# Patient Record
Sex: Male | Born: 1975 | Race: White | Hispanic: No | Marital: Married | Smoking: Never smoker
Health system: Southern US, Community
[De-identification: ages and names within clinical notes are randomized; demographics above are authoritative.]

## PROBLEM LIST (undated history)

## (undated) DIAGNOSIS — G4733 Obstructive sleep apnea (adult) (pediatric): Secondary | ICD-10-CM

## (undated) DIAGNOSIS — K649 Unspecified hemorrhoids: Secondary | ICD-10-CM

## (undated) DIAGNOSIS — J45909 Unspecified asthma, uncomplicated: Secondary | ICD-10-CM

## (undated) DIAGNOSIS — E559 Vitamin D deficiency, unspecified: Secondary | ICD-10-CM

## (undated) DIAGNOSIS — R3129 Other microscopic hematuria: Secondary | ICD-10-CM

## (undated) DIAGNOSIS — N528 Other male erectile dysfunction: Secondary | ICD-10-CM

## (undated) HISTORY — PX: TONSILLECTOMY: SUR1361

## (undated) HISTORY — DX: Obstructive sleep apnea (adult) (pediatric): G47.33

## (undated) HISTORY — DX: Other microscopic hematuria: R31.29

## (undated) HISTORY — DX: Other male erectile dysfunction: N52.8

## (undated) HISTORY — DX: Morbid (severe) obesity due to excess calories: E66.01

## (undated) HISTORY — DX: Vitamin D deficiency, unspecified: E55.9

## (undated) HISTORY — DX: Unspecified asthma, uncomplicated: J45.909

## (undated) HISTORY — DX: Unspecified hemorrhoids: K64.9

---

## 2001-08-26 ENCOUNTER — Encounter: Payer: Self-pay | Admitting: Family Medicine

## 2001-08-26 ENCOUNTER — Encounter: Admission: RE | Admit: 2001-08-26 | Discharge: 2001-08-26 | Payer: Self-pay | Admitting: Family Medicine

## 2012-10-14 DIAGNOSIS — E66813 Obesity, class 3: Secondary | ICD-10-CM | POA: Insufficient documentation

## 2012-10-14 HISTORY — DX: Obesity, class 3: E66.813

## 2012-10-14 HISTORY — DX: Morbid (severe) obesity due to excess calories: E66.01

## 2015-04-19 DIAGNOSIS — R3129 Other microscopic hematuria: Secondary | ICD-10-CM

## 2015-04-19 HISTORY — DX: Other microscopic hematuria: R31.29

## 2016-06-20 DIAGNOSIS — K649 Unspecified hemorrhoids: Secondary | ICD-10-CM

## 2016-06-20 HISTORY — DX: Unspecified hemorrhoids: K64.9

## 2017-01-08 DIAGNOSIS — E559 Vitamin D deficiency, unspecified: Secondary | ICD-10-CM

## 2017-01-08 HISTORY — DX: Vitamin D deficiency, unspecified: E55.9

## 2017-07-29 DIAGNOSIS — N528 Other male erectile dysfunction: Secondary | ICD-10-CM

## 2017-07-29 HISTORY — DX: Other male erectile dysfunction: N52.8

## 2017-10-11 DIAGNOSIS — G4733 Obstructive sleep apnea (adult) (pediatric): Secondary | ICD-10-CM

## 2017-10-11 HISTORY — DX: Obstructive sleep apnea (adult) (pediatric): G47.33

## 2017-10-12 ENCOUNTER — Encounter: Payer: Self-pay | Admitting: Emergency Medicine

## 2017-10-12 ENCOUNTER — Other Ambulatory Visit: Payer: Self-pay

## 2017-10-12 ENCOUNTER — Emergency Department (INDEPENDENT_AMBULATORY_CARE_PROVIDER_SITE_OTHER): Payer: BC Managed Care – PPO

## 2017-10-12 ENCOUNTER — Emergency Department
Admission: EM | Admit: 2017-10-12 | Discharge: 2017-10-12 | Disposition: A | Discharge status: Home / Self Care | Payer: BC Managed Care – PPO | Source: Home / Self Care | Attending: Family Medicine | Admitting: Family Medicine

## 2017-10-12 DIAGNOSIS — W2209XA Striking against other stationary object, initial encounter: Secondary | ICD-10-CM | POA: Diagnosis not present

## 2017-10-12 DIAGNOSIS — S92412A Displaced fracture of proximal phalanx of left great toe, initial encounter for closed fracture: Secondary | ICD-10-CM

## 2017-10-12 DIAGNOSIS — S92415A Nondisplaced fracture of proximal phalanx of left great toe, initial encounter for closed fracture: Secondary | ICD-10-CM

## 2017-10-12 NOTE — Discharge Instructions (Addendum)
Apply ice pack for 15 to 20 minutes, 3 to 4 times daily  Continue until pain and swelling decrease.  Buddy tape toes.  Wear post-op sandal.  Elevate foot whenever possible.  May take Tylenol as needed for pain.

## 2017-10-12 NOTE — ED Triage Notes (Signed)
Patient stubbed left large toe one hour ago; took 2 aleve.

## 2017-10-12 NOTE — ED Provider Notes (Signed)
[facility]    CSN: [id number] Arrival date & time: 10/12/17  1711     History   Chief Complaint Chief Complaint  Patient presents with  . Toe Pain    HPI Erik Roach is a 42 y.o. male.   Patient bumped his left great toe about one hour ago with resultant pain  The history is provided by the patient.  Toe Pain  Episode onset: one hour ago. The problem occurs constantly. The problem has not changed since onset.The symptoms are aggravated by walking. Nothing relieves the symptoms. Treatments tried: Aleve. The treatment provided mild relief.    History reviewed. No pertinent past medical history.  There are no active problems to display for this patient.   History reviewed. No pertinent surgical history.     Home Medications    Prior to Admission medications   Not on File    Family History No family history on file.  Social History Social History   Tobacco Use  . Smoking status: Not on file  Substance Use Topics  . Alcohol use: Not on file  . Drug use: Not on file     Allergies   Penicillins   Review of Systems Review of Systems  All other systems reviewed and are negative.    Physical Exam Triage Vital Signs ED Triage Vitals  Enc Vitals Group     BP 10/12/17 1729 123/78     Pulse Rate 10/12/17 1729 79     Resp 10/12/17 1729 18     Temp 10/12/17 1729 98 F (36.7 C)     Temp Source 10/12/17 1729 Oral     SpO2 10/12/17 1729 96 %     Weight 10/12/17 1730 (!) 350 lb (158.8 kg)     Height 10/12/17 1730 6\' 2"  (1.88 m)     Head Circumference --      Peak Flow --      Pain Score 10/12/17 1730 4     Pain Loc --      Pain Edu? --      Excl. in GC? --    No data found.  Updated Vital Signs BP 123/78 (BP Location: Right Arm)   Pulse 79   Temp 98 F (36.7 C) (Oral)   Resp 18   Ht 6\' 2"  (1.88 m)   Wt (!) 158.8 kg   SpO2 96%   BMI 44.94 kg/m   Visual Acuity Right Eye Distance:   Left Eye Distance:   Bilateral  Distance:    Right Eye Near:   Left Eye Near:    Bilateral Near:     Physical Exam  Constitutional: He appears well-developed and well-nourished. No distress.  HENT:  Head: Atraumatic.  Eyes: Pupils are equal, round, and reactive to light.  Cardiovascular: Normal rate.  Pulmonary/Chest: Effort normal.  Musculoskeletal:       Left foot: There is decreased range of motion, tenderness, bony tenderness and swelling. There is normal capillary refill, no crepitus, no deformity and no laceration.       Feet:  Left great toe has tenderness to palpation and mild swelling over the proximal phalanx and MTP joint.  Neurological: He is alert.  Skin: Skin is warm and dry.  Nursing note and vitals reviewed.    UC Treatments / Results  Labs (all labs ordered are listed, but only abnormal results are displayed) Labs Reviewed - No data to display  EKG None  Radiology Dg Foot Complete Left  Result Date: 10/12/2017 CLINICAL DATA:  Stubbed toe. EXAM: LEFT FOOT - COMPLETE 3+ VIEW COMPARISON:  None. FINDINGS: Small Achilles spur. Oblique fracture involving the proximal phalanx of the first digit. No intra-articular extension. IMPRESSION: Oblique fracture of the proximal phalanx of the first digit. Electronically Signed   By: Jeronimo GreavesKyle  Talbot M.D.   On: 10/12/2017 17:52    Procedures Procedures (including critical care time)  Medications Ordered in UC Medications - No data to display  Initial Impression / Assessment and Plan / UC Course  I have reviewed the triage vital signs and the nursing notes.  Pertinent labs & imaging results that were available during my care of the patient were reviewed by me and considered in my medical decision making (see chart for details).     Toe strapped using "Buddy Tape" technique.  Dispensed post-op sandal. Followup with Dr. Rodney Langtonhomas Thekkekandam or Dr. Clementeen GrahamEvan Corey ([facility]) if not improving about two weeks.    Final Clinical Impressions(s) /  UC Diagnoses   Final diagnoses:  Closed nondisplaced fracture of proximal phalanx of left great toe, initial encounter     Discharge Instructions     Apply ice pack for 15 to 20 minutes, 3 to 4 times daily  Continue until pain and swelling decrease.  Buddy tape toes.  Wear post-op sandal.  Elevate foot whenever possible.  May take Tylenol as needed for pain.    ED Prescriptions    None        Lattie HawBeese, Stephen A, MD 10/16/17 1021

## 2017-11-14 ENCOUNTER — Ambulatory Visit (INDEPENDENT_AMBULATORY_CARE_PROVIDER_SITE_OTHER): Payer: BC Managed Care – PPO | Admitting: Family Medicine

## 2017-11-14 ENCOUNTER — Ambulatory Visit (INDEPENDENT_AMBULATORY_CARE_PROVIDER_SITE_OTHER): Payer: BC Managed Care – PPO

## 2017-11-14 VITALS — BP 126/69 | HR 55 | Ht 73.0 in | Wt 358.0 lb

## 2017-11-14 DIAGNOSIS — S92415D Nondisplaced fracture of proximal phalanx of left great toe, subsequent encounter for fracture with routine healing: Secondary | ICD-10-CM

## 2017-11-14 DIAGNOSIS — S92415A Nondisplaced fracture of proximal phalanx of left great toe, initial encounter for closed fracture: Secondary | ICD-10-CM

## 2017-11-14 DIAGNOSIS — W2203XD Walked into furniture, subsequent encounter: Secondary | ICD-10-CM | POA: Diagnosis not present

## 2017-11-14 NOTE — Patient Instructions (Signed)
Thank you for coming in today. Use the cam walker boot.  Recheck in 2 weeks.

## 2017-11-15 ENCOUNTER — Encounter: Payer: Self-pay | Admitting: Family Medicine

## 2017-11-15 NOTE — Progress Notes (Signed)
    Subjective:    CC: Left great toe fracture  HPI: Erik Roach suffered a fracture of his left great toe he was seen in urgent care on August 17.  He was given a postop shoe and advised to follow-up with me in a week or 2.  He notes it has been 4 weeks now and his pain is still present in his toe with ambulation.  He denies any radiating pain weakness or numbness.  He is tried some Tylenol and buddy taping as well as the postop shoe.  He tried transitioning to a regular shoe last week which did not help.  He denies any fevers chills nausea vomiting or diarrhea.  Past medical history, Surgical history, Family history not pertinant except as noted below, Social history, Allergies, and medications have been entered into the medical record, reviewed, and no changes needed.   Review of Systems: No headache, visual changes, nausea, vomiting, diarrhea, constipation, dizziness, abdominal pain, skin rash, fevers, chills, night sweats, weight loss, swollen lymph nodes, body aches, joint swelling, muscle aches, chest pain, shortness of breath, mood changes, visual or auditory hallucinations.   Objective:    Vitals:   11/14/17 1442  BP: 126/69  Pulse: (!) 55   General: Well Developed, well nourished, and in no acute distress.  Neuro/Psych: Alert and oriented x3, extra-ocular muscles intact, able to move all 4 extremities, sensation grossly intact. Skin: Warm and dry, no rashes noted.  Respiratory: Not using accessory muscles, speaking in full sentences, trachea midline.  Cardiovascular: Pulses palpable, no extremity edema. Abdomen: Does not appear distended. MSK: Left great toe slightly swollen but nonerythematous.  No deformity noted. Tender to palpation at the proximal phalanx.  Capillary fill and sensation are intact distally.  Lab and Radiology Results X-ray images personally independently reviewed left great toe. Oblique nondisplaced fracture through the proximal phalanx involving the  interphalangeal joint.  Healing present since x-ray images dated August 17 however not fully healed.  No further displacement. Formal radiology review.    Impression and Recommendations:    Assessment and Plan: 42 y.o. male with left great toe fracture.  Unfortunately delayed of care from urgent care to sports medicine care.  Patient still somewhat symptomatic.  I think is reasonable to switch to a Cam walker boot as that will provide less pressure at the first toe.  Plan to proceed with a Cam walker boot and recheck in 2 weeks.  No need for buddy taping the great toe.  Continue Tylenol as needed..   Orders Placed This Encounter  Procedures  . DG Toe Great Left    Order Specific Question:   Reason for exam:    Answer:   follow up fracture    Order Specific Question:   [facility]?    [facility]   No orders of the defined types were placed in this encounter.   Discussed warning signs or symptoms. Please see discharge instructions. Patient expresses understanding.

## 2017-11-28 ENCOUNTER — Ambulatory Visit (INDEPENDENT_AMBULATORY_CARE_PROVIDER_SITE_OTHER): Payer: BC Managed Care – PPO

## 2017-11-28 ENCOUNTER — Ambulatory Visit: Payer: BC Managed Care – PPO | Admitting: Family Medicine

## 2017-11-28 DIAGNOSIS — W2209XD Striking against other stationary object, subsequent encounter: Secondary | ICD-10-CM

## 2017-11-28 DIAGNOSIS — S92415D Nondisplaced fracture of proximal phalanx of left great toe, subsequent encounter for fracture with routine healing: Secondary | ICD-10-CM

## 2017-11-28 DIAGNOSIS — S92415A Nondisplaced fracture of proximal phalanx of left great toe, initial encounter for closed fracture: Secondary | ICD-10-CM | POA: Diagnosis not present

## 2017-11-28 NOTE — Patient Instructions (Signed)
Thank you for coming in today. Continue cam walker  Continue Vit D.  Reasonable to add calcium., You can take 1000mg  of calcium in Tums.   Recheck with me in 4 weeks if all is well.   Next step is regular shoe with turf toe insole.   Get a Steel Turf Toe insole.  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

## 2017-11-28 NOTE — Progress Notes (Signed)
Erik Roach is a 42 y.o. male who presents to [facility] today for follow-up left great toe fracture.  Patient was first seen on September 19 for comminuted intra-articular fracture of the proximal phalanx of the left great toe.  His original injury was August 17 where he seen in urgent care.  He was somewhat lost to follow-up until his reestablish with me in mid September.  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  He notes with the Cam walker boot he is feeling much better with significantly reduced pain in his left foot.  He feels well and thinks things are improving.    ROS:  As above  Exam:   General: Well Developed, well nourished, and in no acute distress.  Neuro/Psych: Alert and oriented x3, extra-ocular muscles intact, able to move all 4 extremities, sensation grossly intact. Skin: Warm and dry, no rashes noted.  Respiratory: Not using accessory muscles, speaking in full sentences, trachea midline.  Cardiovascular: Pulses palpable, no extremity edema. Abdomen: Does not appear distended. MSK: Left foot relatively normal-appearing without significant swelling.  Toe is minimally tender.  Pulses cap refill and sensation are intact distally.    Lab and Radiology Results X-ray left great toe shows persistent fracture line at the proximal phalanx somewhat comminuted involving joint however no significant displacement.  Callus formation is present now and slight improved healing compared to prior studies. Await formal radiology review    Assessment and Plan: 42 y.o. male with left great toe fracture improving clinically and radiographically.  Continue Cam walker boot.  Recheck in 2 to 4 weeks.  Return sooner if needed.   I spent 15 minutes with this patient, greater than 50% was face-to-face time counseling regarding ddx and  plan.  Orders Placed This Encounter  Procedures  . DG Toe Great Left    Order Specific Question:   Reason for exam:    Answer:   foot pain    Order Specific Question:   Preferred imaging location?    Answer[facility]   No orders of the defined types were placed in this encounter.   Historical information moved to improve visibility of documentation.  Past Medical History:  Diagnosis Date  . Asthma   . Hemorrhoids 06/20/2016  . Microscopic hematuria 04/19/2015   Negative complete workup by Dr. Andrey Campanile as of 04/2015 Negative complete workup by Dr. Andrey Campanile as of 04/2015  . Obesity, Class III, BMI 40-49.9 (morbid obesity) (HCC) 10/14/2012  . Other male erectile dysfunction 07/29/2017  . Sleep apnea, obstructive 10/11/2017  . Vitamin D deficiency 01/08/2017   Past Surgical History:  Procedure Laterality Date  . TONSILLECTOMY     Social History   Tobacco Use  . Smoking status: Never Smoker  . Smokeless tobacco: Never Used  Substance Use Topics  . Alcohol use: Yes   family history is not on file.  Medications: Current Outpatient Medications  Medication Sig Dispense Refill  . fluticasone (FLONASE) 50 MCG/ACT nasal spray Place into the nose.    . loratadine (CLARITIN) 10 MG tablet Take by mouth.    . Misc. Devices MISC Initiate auto CPAP at 5-15 cm. water pressure.  New mask and supplies.    . Vitamin D, Ergocalciferol, (DRISDOL) 50000 units CAPS capsule Take by mouth.     No current facility-administered medications for this visit.  Allergies  Allergen Reactions  . Penicillins       Discussed warning signs or symptoms. Please see discharge instructions. Patient expresses understanding.

## 2017-12-26 ENCOUNTER — Ambulatory Visit (INDEPENDENT_AMBULATORY_CARE_PROVIDER_SITE_OTHER): Payer: BC Managed Care – PPO | Admitting: Family Medicine

## 2017-12-26 ENCOUNTER — Encounter: Payer: Self-pay | Admitting: Family Medicine

## 2017-12-26 ENCOUNTER — Ambulatory Visit (INDEPENDENT_AMBULATORY_CARE_PROVIDER_SITE_OTHER): Payer: BC Managed Care – PPO

## 2017-12-26 VITALS — BP 133/77 | HR 57 | Ht 74.0 in | Wt 366.0 lb

## 2017-12-26 DIAGNOSIS — W2203XD Walked into furniture, subsequent encounter: Secondary | ICD-10-CM | POA: Diagnosis not present

## 2017-12-26 DIAGNOSIS — E559 Vitamin D deficiency, unspecified: Secondary | ICD-10-CM | POA: Diagnosis not present

## 2017-12-26 DIAGNOSIS — S92415A Nondisplaced fracture of proximal phalanx of left great toe, initial encounter for closed fracture: Secondary | ICD-10-CM | POA: Diagnosis not present

## 2017-12-26 DIAGNOSIS — S92415D Nondisplaced fracture of proximal phalanx of left great toe, subsequent encounter for fracture with routine healing: Secondary | ICD-10-CM

## 2017-12-26 NOTE — Patient Instructions (Addendum)
Thank you for coming in today. I have referred to podiatry in the building here.  You should hear from them about an appointment soon.  If you do not hear anything by midweek next week let me know.   Continue boot.  Let me know what next steps are.   Get vit D checked. Can do that now with labs.

## 2017-12-26 NOTE — Progress Notes (Signed)
Erik Roach is a 42 y.o. male who presents to [facility] today for left great toe fracture.  Erik Roach suffered a fracture of his left great toe proximal phalanx about 10 weeks ago.  He was managed conservatively with Cam walker boot.  He notes the pain is pretty well controlled but if he tries to do any activity out of the boot it does hurt.  He does also note some limitation in range of motion of his toe.  He has a history of vitamin D deficiency and is taking both calcium and vitamin D.  He feels well otherwise no fevers or chills.    ROS:  As above  Exam:  BP 133/77   Pulse (!) 57   Ht 6\' 2"  (1.88 m)   Wt (!) 366 lb (166 kg)   BMI 46.99 kg/m  General: Well Developed, well nourished, and in no acute distress.  Neuro/Psych: Alert and oriented x3, extra-ocular muscles intact, able to move all 4 extremities, sensation grossly intact. Skin: Warm and dry, no rashes noted.  Respiratory: Not using accessory muscles, speaking in full sentences, trachea midline.  Cardiovascular: Pulses palpable, no extremity edema. Abdomen: Does not appear distended. MSK: Left great toe slightly swollen otherwise normal-appearing tender to palpation mildly at MTP and IP joints.  Decreased motion with pain present at both MTP and IP joint.  Pulses capillary refill and sensation are intact distally.    Lab and Radiology Results X-ray left great toe shows continued oblique fracture involving the interphalangeal joint without significant healing or callus formation.  No significant change from prior x-ray.  X-ray images personally independently reviewed. Await formal radiology review.    Assessment and Plan: 42 y.o. male with left great toe fracture.  Unfortunately fracture is not healing and is now 10 weeks and 2 conservative management.  At this point I am not optimistic about healing with continued immobilization and I think is reasonable to have a second  opinion with podiatry/orthopedics.  Will refer to podiatry and follow-up as needed.  Additionally given his history of vitamin D deficiency I think is reasonable to check a vitamin D level today as that may be exacerbating factor to his poor bone healing.    Orders Placed This Encounter  Procedures  . DG Toe Great Left    Order Specific Question:   Reason for exam:    Answer:   f/u fx    Order Specific Question:   Preferred imaging location?    [facility]   [facility]  . VITAMIN D 25 Hydroxy (Vit-D Deficiency, Fractures)  . Ambulatory referral to Podiatry    Referral Priority:   Routine    Referral Type:   Consultation    Referral Reason:   Specialty Services Required    Requested Specialty:   Podiatry    Number of Visits Requested:   1   No orders of the defined types were placed in this encounter.   Historical information moved to improve visibility of documentation.  Past Medical History:  Diagnosis Date  . Asthma   . Hemorrhoids 06/20/2016  . Microscopic hematuria 04/19/2015   Negative complete workup by Dr. Andrey Campanile as of 04/2015 Negative complete workup by Dr. Andrey Campanile as of 04/2015  . Obesity, Class III, BMI 40-49.9 (morbid obesity) (HCC) 10/14/2012  . Other male erectile dysfunction 07/29/2017  . Sleep apnea, obstructive 10/11/2017  . Vitamin D deficiency 01/08/2017   Past Surgical History:  Procedure Laterality Date  .  TONSILLECTOMY     Social History   Tobacco Use  . Smoking status: Never Smoker  . Smokeless tobacco: Never Used  Substance Use Topics  . Alcohol use: Yes   family history is not on file.  Medications: Current Outpatient Medications  Medication Sig Dispense Refill  . fluticasone (FLONASE) 50 MCG/ACT nasal spray Place into the nose.    . loratadine (CLARITIN) 10 MG tablet Take by mouth.    . Misc. Devices MISC Initiate auto CPAP at 5-15 cm. water pressure.  New mask and supplies.    . Vitamin D, Ergocalciferol, (DRISDOL) 50000 units CAPS  capsule Take by mouth.     No current facility-administered medications for this visit.    Allergies  Allergen Reactions  . Penicillins       Discussed warning signs or symptoms. Please see discharge instructions. Patient expresses understanding.

## 2017-12-27 ENCOUNTER — Encounter: Payer: Self-pay | Admitting: Family Medicine

## 2017-12-27 ENCOUNTER — Telehealth: Payer: Self-pay | Admitting: Family Medicine

## 2017-12-27 LAB — VITAMIN D 25 HYDROXY (VIT D DEFICIENCY, FRACTURES): VIT D 25 HYDROXY: 26 ng/mL — AB (ref 30–100)

## 2017-12-27 NOTE — Telephone Encounter (Signed)
Letter to the insurance company sent today.

## 2017-12-27 NOTE — Telephone Encounter (Signed)
Patient has been advised. Rhonda Cunningham,CMA  

## 2020-02-27 DIAGNOSIS — U071 COVID-19: Secondary | ICD-10-CM

## 2020-02-27 HISTORY — DX: COVID-19: U07.1

## 2020-08-12 IMAGING — DX DG TOE GREAT 2+V*L*
3 series · 3 of 3 positions shown · non-contrast
Comparison: 10/12/2017 left foot radiographs

CLINICAL DATA: Follow-up first toe fracture

EXAM:
LEFT GREAT TOE

[toe ap]
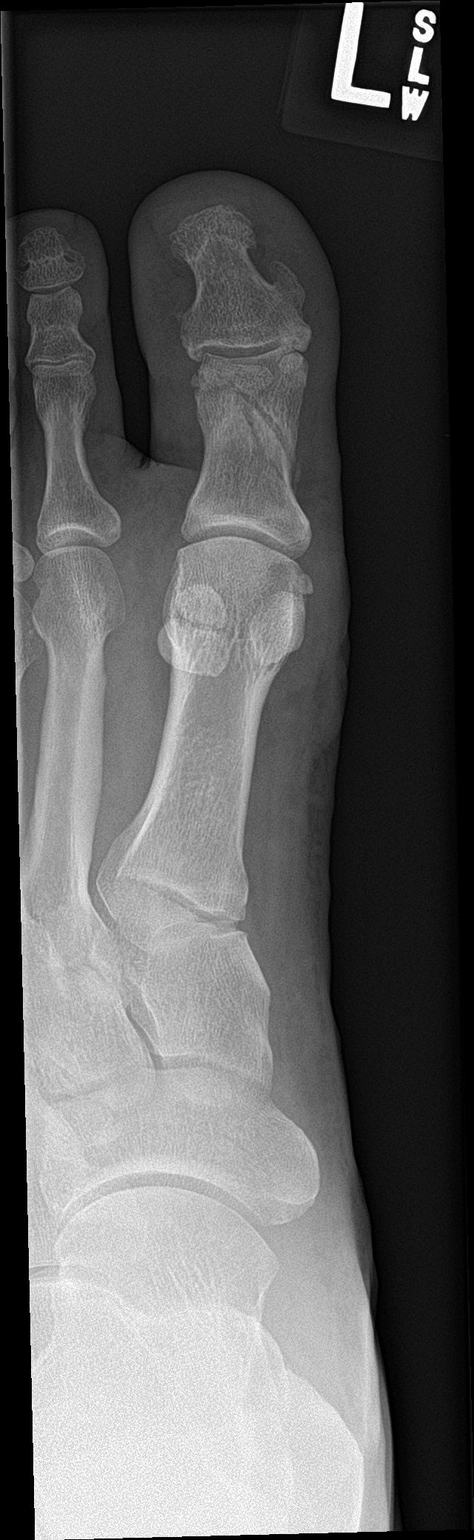

[toe obl]
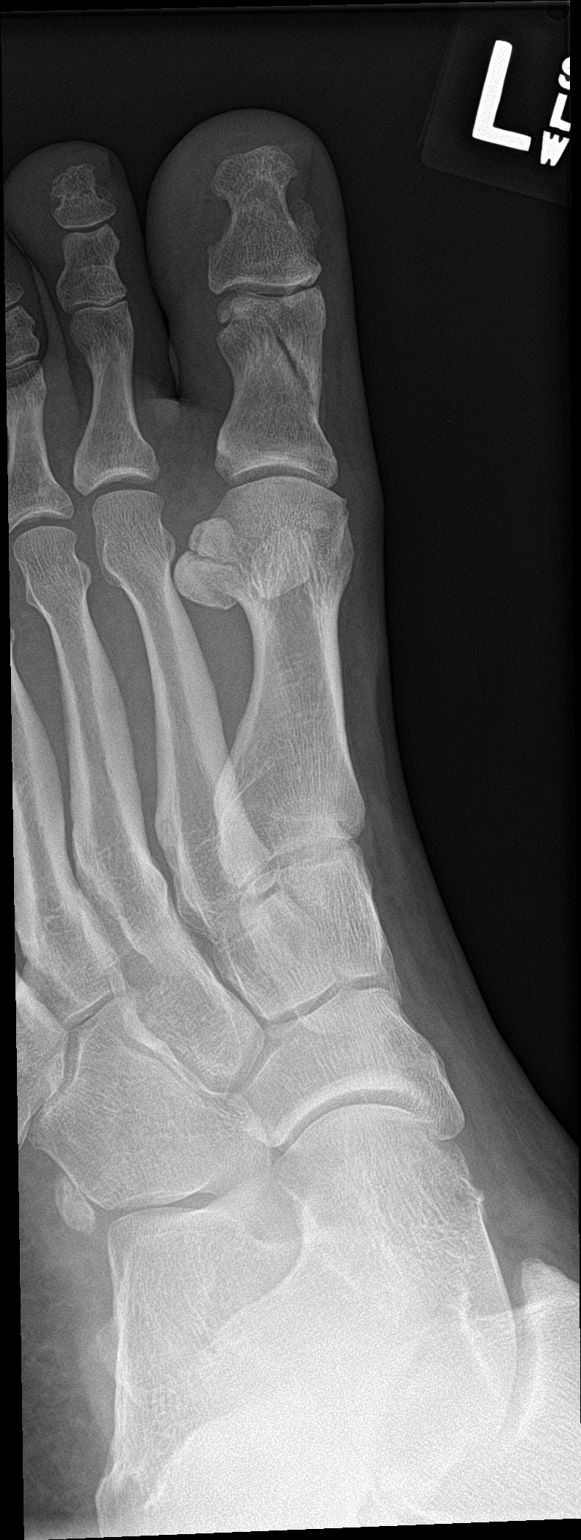

[toe lat]
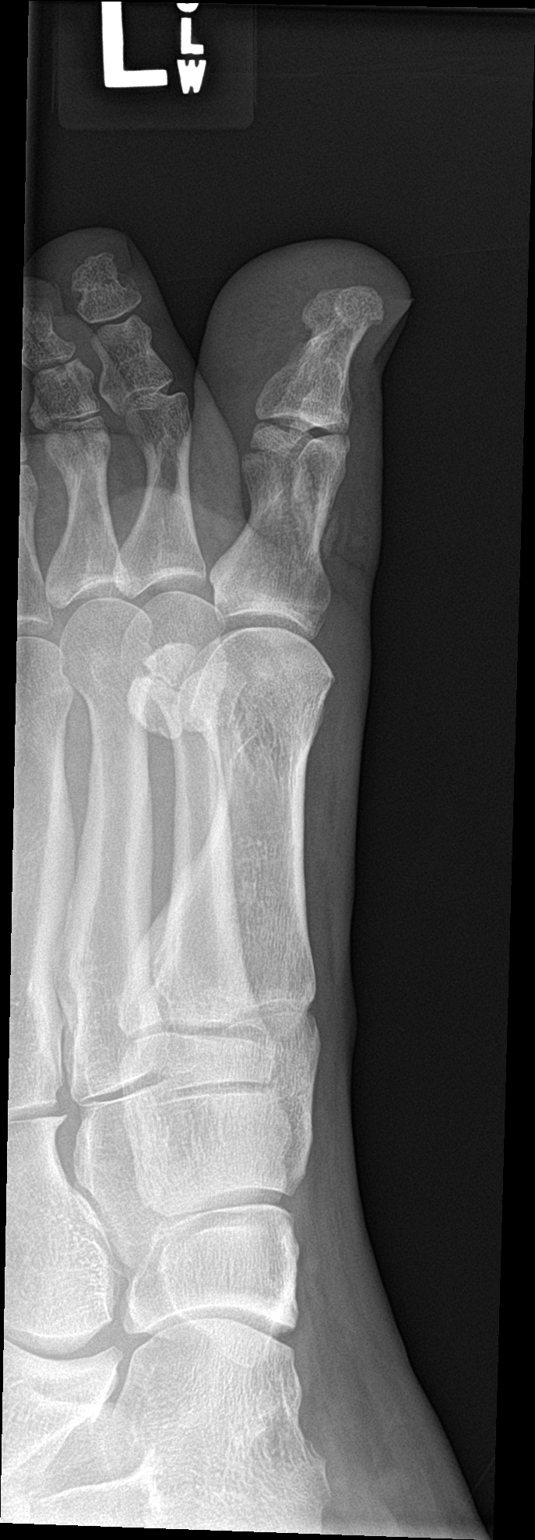

[3 of 3 positions shown; findings below may reference images not displayed]

FINDINGS: Nondisplaced comminuted intra-articular fracture of the distal
aspect of the proximal phalanx in the left first toe, with mild
periosteal reaction indicating early healing response. No interval
fracture. Bipartite medial first metatarsal sesamoid. No
dislocation. No suspicious focal osseous lesion. No radiopaque
foreign body.
IMPRESSION: Nondisplaced comminuted intra-articular proximal phalanx left first
toe fracture with evidence of early healing response.

## 2020-12-17 ENCOUNTER — Other Ambulatory Visit: Payer: Self-pay

## 2020-12-17 ENCOUNTER — Encounter: Payer: Self-pay | Admitting: Emergency Medicine

## 2020-12-17 ENCOUNTER — Emergency Department
Admission: EM | Admit: 2020-12-17 | Discharge: 2020-12-17 | Disposition: A | Discharge status: Home / Self Care | Payer: BC Managed Care – PPO | Source: Home / Self Care

## 2020-12-17 DIAGNOSIS — J3489 Other specified disorders of nose and nasal sinuses: Secondary | ICD-10-CM

## 2020-12-17 DIAGNOSIS — B9689 Other specified bacterial agents as the cause of diseases classified elsewhere: Secondary | ICD-10-CM | POA: Diagnosis not present

## 2020-12-17 DIAGNOSIS — J019 Acute sinusitis, unspecified: Secondary | ICD-10-CM

## 2020-12-17 DIAGNOSIS — J309 Allergic rhinitis, unspecified: Secondary | ICD-10-CM

## 2020-12-17 MED ORDER — SULFAMETHOXAZOLE-TRIMETHOPRIM 800-160 MG PO TABS: 1.0000 | ORAL_TABLET | Freq: Two times a day (BID) | ORAL | 0 refills | Status: AC

## 2020-12-17 MED ORDER — FEXOFENADINE HCL 180 MG PO TABS: 180.0000 mg | ORAL_TABLET | Freq: Every day | ORAL | 0 refills | Status: AC

## 2020-12-17 MED ORDER — PREDNISONE 20 MG PO TABS: ORAL_TABLET | ORAL | 0 refills | Status: DC

## 2020-12-17 NOTE — ED Provider Notes (Signed)
[name]    CSN: [id number] Arrival date & time: 12/17/20  1035      History   Chief Complaint Chief Complaint  Patient presents with   Facial Pain   Cough    HPI Erik Roach is a 45 y.o. male.   HPI 45 year old male presents with sinus nasal congestion, sinus pressure and cough for 5 days.  Reports using OTC Mucinex.  Past Medical History:  Diagnosis Date   Asthma    COVID-19 02/2020   vaccine x 2 - no booster   Hemorrhoids 06/20/2016   Microscopic hematuria 04/19/2015   Negative complete workup by Dr. Andrey Campanile as of 04/2015 Negative complete workup by Dr. Andrey Campanile as of 04/2015   Obesity, Class III, BMI 40-49.9 (morbid obesity) (HCC) 10/14/2012   Other male erectile dysfunction 07/29/2017   Sleep apnea, obstructive 10/11/2017   Vitamin D deficiency 01/08/2017    Patient Active Problem List   Diagnosis Date Noted   Sleep apnea, obstructive 10/11/2017   Other male erectile dysfunction 07/29/2017   Vitamin D deficiency 01/08/2017   Hemorrhoids 06/20/2016   Microscopic hematuria 04/19/2015   Obesity, Class III, BMI 40-49.9 (morbid obesity) (HCC) 10/14/2012    Past Surgical History:  Procedure Laterality Date   TONSILLECTOMY         Home Medications    Prior to Admission medications   Medication Sig Start Date End Date Taking? Authorizing Provider  Cholecalciferol (VITAMIN D) 125 MCG (5000 UT) CAPS Take by mouth.   Yes [provider]  fexofenadine (ALLEGRA ALLERGY) 180 MG tablet Take 1 tablet (180 mg total) by mouth daily for 15 days. 12/17/20 01/01/21 Yes Trevor Iha, FNP  levocetirizine (XYZAL) 5 MG tablet Take 5 mg by mouth every evening.   Yes [provider]  predniSONE (DELTASONE) 20 MG tablet Take 4 tabs PO daily x 5 days. 12/17/20  Yes Trevor Iha, FNP  sulfamethoxazole-trimethoprim (BACTRIM DS) 800-160 MG tablet Take 1 tablet by mouth 2 (two) times daily for 10 days. 12/17/20 12/27/20 Yes Trevor Iha, FNP   fluticasone (FLONASE) 50 MCG/ACT nasal spray Place into the nose.    [provider]  loratadine (CLARITIN) 10 MG tablet Take by mouth. Patient not taking: Reported on 12/17/2020    [provider]  Misc. Devices MISC Initiate auto CPAP at 5-15 cm. water pressure.  New mask and supplies. 08/16/17   [provider]  Vitamin D, Ergocalciferol, (DRISDOL) 50000 units CAPS capsule Take by mouth. Patient not taking: Reported on 12/17/2020 06/01/16   [provider]    Family History Family History  Problem Relation Age of Onset   Diabetes Mother    Diabetes Father     Social History Social History   Tobacco Use   Smoking status: Never    Passive exposure: Never   Smokeless tobacco: Never  Vaping Use   Vaping Use: Never used  Substance Use Topics   Alcohol use: Yes   Drug use: Never     Allergies   Penicillins   Review of Systems Review of Systems  HENT:  Positive for congestion and sinus pressure.   Respiratory:  Positive for cough.   All other systems reviewed and are negative.   Physical Exam Triage Vital Signs ED Triage Vitals  Enc Vitals Group     BP 12/17/20 1140 124/83     Pulse Rate 12/17/20 1140 74     Resp 12/17/20 1140 17     Temp 12/17/20 1140 98.7  F (37.1 C)     Temp Source 12/17/20 1140 Oral     SpO2 12/17/20 1140 97 %     Weight 12/17/20 1143 (!) 350 lb (158.8 kg)     Height 12/17/20 1134 6\' 2"  (1.88 m)     Head Circumference --      Peak Flow --      Pain Score 12/17/20 1126 3     Pain Loc --      Pain Edu? --      Excl. in GC? --    No data found.  Updated Vital Signs BP 124/83 (BP Location: Right Arm)   Pulse 74   Temp 98.7 F (37.1 C) (Oral)   Resp 17   Ht 6\' 2"  (1.88 m)   Wt (!) 350 lb (158.8 kg)   SpO2 97%   BMI 44.94 kg/m     Physical Exam Vitals and nursing note reviewed.  Constitutional:      General: He is not in acute distress.    Appearance: Normal appearance. He is obese. He is  not ill-appearing.  HENT:     Head: Normocephalic and atraumatic.     Right Ear: Tympanic membrane, ear canal and external ear normal.     Left Ear: Tympanic membrane, ear canal and external ear normal.     Mouth/Throat:     Mouth: Mucous membranes are moist.     Pharynx: Oropharynx is clear.     Comments: Moderate amount of clear drainage of posterior oropharynx noted Eyes:     Extraocular Movements: Extraocular movements intact.     Conjunctiva/sclera: Conjunctivae normal.     Pupils: Pupils are equal, round, and reactive to light.  Cardiovascular:     Rate and Rhythm: Normal rate and regular rhythm.     Pulses: Normal pulses.     Heart sounds: Normal heart sounds.  Pulmonary:     Effort: Pulmonary effort is normal.     Breath sounds: Normal breath sounds.  Musculoskeletal:        General: Normal range of motion.     Cervical back: Normal range of motion and neck supple.  Skin:    General: Skin is warm and dry.  Neurological:     General: No focal deficit present.     Mental Status: He is alert and oriented to person, place, and time. Mental status is at baseline.     UC Treatments / Results  Labs (all labs ordered are listed, but only abnormal results are displayed) Labs Reviewed - No data to display  EKG   Radiology No results found.  Procedures Procedures (including critical care time)  Medications Ordered in UC Medications - No data to display  Initial Impression / Assessment and Plan / UC Course  I have reviewed the triage vital signs and the nursing notes.  Pertinent labs & imaging results that were available during my care of the patient were reviewed by me and considered in my medical decision making (see chart for details).     MDM: 1.  Acute bacterial rhinosinusitis-Rx'd Bactrim; 2.  Sinus pressure-Rx'd prednisone burst; 3.  Allergic rhinitis-Rx'd Allegra.Advised patient to take medication as directed with food to completion.  Advised patient to take  prednisone burst with first dose of antibiotic for 5 of 10 days.  Advised/encouraged patient to discontinue Flonase, Xyzal, Claritin and start Allegra daily for the next 5 to 7 days then as needed for concurrent postnasal drainage/drip.  Encouraged patient increase daily water  intake while taking these medications.  Patient discharged home, hemodynamically stable. Final Clinical Impressions(s) / UC Diagnoses   Final diagnoses:  Acute bacterial rhinosinusitis  Sinus pressure  Allergic rhinitis, unspecified seasonality, unspecified trigger     Discharge Instructions      Advised patient to take medication as directed with food to completion.  Advised patient to take prednisone burst with first dose of antibiotic for 5 of 10 days.  Advised/encouraged patient to discontinue Flonase, Xyzal, Claritin and start Allegra daily for the next 5 to 7 days then as needed for concurrent postnasal drainage/drip.  Encouraged patient increase daily water intake while taking these medications.     ED Prescriptions     Medication Sig Dispense Auth. Provider   sulfamethoxazole-trimethoprim (BACTRIM DS) 800-160 MG tablet Take 1 tablet by mouth 2 (two) times daily for 10 days. 20 tablet Trevor Iha, FNP   fexofenadine [facility] ALLERGY) 180 MG tablet Take 1 tablet (180 mg total) by mouth daily for 15 days. 15 tablet Trevor Iha, FNP   predniSONE (DELTASONE) 20 MG tablet Take 4 tabs PO daily x 5 days. 20 tablet Trevor Iha, FNP      PDMP not reviewed this encounter.   Trevor Iha, FNP 12/17/20 [phone]

## 2020-12-17 NOTE — Discharge Instructions (Addendum)
Advised patient to take medication as directed with food to completion.  Advised patient to take prednisone burst with first dose of antibiotic for 5 of 10 days.  Advised/encouraged patient to discontinue Flonase, Xyzal, Claritin and start Allegra daily for the next 5 to 7 days then as needed for concurrent postnasal drainage/drip.  Encouraged patient increase daily water intake while taking these medications.

## 2020-12-17 NOTE — ED Triage Notes (Signed)
Sinus pressure & cough  Since Monday  OTC mucinex  Denies fever or chills  COVID 12/21 COVID vaccine x 2 No flu vaccine

## 2021-05-15 ENCOUNTER — Emergency Department
Admission: EM | Admit: 2021-05-15 | Discharge: 2021-05-15 | Disposition: A | Discharge status: Home / Self Care | Payer: BC Managed Care – PPO | Source: Home / Self Care

## 2021-05-15 DIAGNOSIS — R059 Cough, unspecified: Secondary | ICD-10-CM | POA: Diagnosis not present

## 2021-05-15 DIAGNOSIS — R0989 Other specified symptoms and signs involving the circulatory and respiratory systems: Secondary | ICD-10-CM

## 2021-05-15 MED ORDER — METHYLPREDNISOLONE SODIUM SUCC 125 MG IJ SOLR
125.0000 mg | Freq: Once | INTRAMUSCULAR | Status: AC
  Administered 2021-05-15: 125 mg via INTRAMUSCULAR

## 2021-05-15 MED ORDER — PREDNISONE 20 MG PO TABS: ORAL_TABLET | ORAL | 0 refills | Status: DC

## 2021-05-15 MED ORDER — DOXYCYCLINE HYCLATE 100 MG PO CAPS: 100.0000 mg | ORAL_CAPSULE | Freq: Two times a day (BID) | ORAL | 0 refills | Status: AC

## 2021-05-15 NOTE — ED Provider Notes (Signed)
?[facility] ? ? ? ?CSN: [id number] ?Arrival date & time: 05/15/21  0920 ? ? ?  ? ?History   ?Chief Complaint ?Chief Complaint  ?Patient presents with  ? Nasal Congestion  ?  Nasal congestion, coughing, wheezing, and sob. X3 days  ? ? ?HPI ?Erik Roach is a 46 y.o. male.  ? ?HPI Pleasant 46 year old male presents with nasal congestion, coughing, wheezing and shortness of breath for 5-6 days.  PMH significant for morbid obesity and OSA. ? ?Past Medical History:  ?Diagnosis Date  ? Asthma   ? COVID-19 02/2020  ? vaccine x 2 - no booster  ? Hemorrhoids 06/20/2016  ? Microscopic hematuria 04/19/2015  ? Negative complete workup by Dr. Andrey Campanile as of 04/2015 Negative complete workup by Dr. Andrey Campanile as of 04/2015  ? Obesity, Class III, BMI 40-49.9 (morbid obesity) (HCC) 10/14/2012  ? Other male erectile dysfunction 07/29/2017  ? Sleep apnea, obstructive 10/11/2017  ? Vitamin D deficiency 01/08/2017  ? ? ?Patient Active Problem List  ? Diagnosis Date Noted  ? Sleep apnea, obstructive 10/11/2017  ? Other male erectile dysfunction 07/29/2017  ? Vitamin D deficiency 01/08/2017  ? Hemorrhoids 06/20/2016  ? Microscopic hematuria 04/19/2015  ? Obesity, Class III, BMI 40-49.9 (morbid obesity) (HCC) 10/14/2012  ? ? ?Past Surgical History:  ?Procedure Laterality Date  ? TONSILLECTOMY    ? ? ? ? ? ?Home Medications   ? ?Prior to Admission medications   ?Medication Sig Start Date End Date Taking? Authorizing Provider  ?Cholecalciferol (VITAMIN D) 125 MCG (5000 UT) CAPS Take by mouth.   Yes [provider]  ?doxycycline (VIBRAMYCIN) 100 MG capsule Take 1 capsule (100 mg total) by mouth 2 (two) times daily for 10 days. 05/15/21 05/25/21 Yes Trevor Iha, FNP  ?fexofenadine (ALLEGRA ALLERGY) 180 MG tablet Take 1 tablet (180 mg total) by mouth daily for 15 days. 12/17/20 05/15/21 Yes Trevor Iha, FNP  ?fluticasone (FLONASE) 50 MCG/ACT nasal spray Place into the nose.   Yes [provider]  ?predniSONE  (DELTASONE) 20 MG tablet Take 4 tabs PO daily x 5 days. 05/15/21  Yes Trevor Iha, FNP  ?Misc. Devices MISC Initiate auto CPAP at 5-15 cm. water pressure. ? ?New mask and supplies. 08/16/17   [provider]  ?Vitamin D, Ergocalciferol, (DRISDOL) 50000 units CAPS capsule Take by mouth. ?Patient not taking: Reported on 12/17/2020 06/01/16   [provider]  ? ? ?Family History ?Family History  ?Problem Relation Age of Onset  ? Diabetes Mother   ? Diabetes Father   ? ? ?Social History ?Social History  ? ?Tobacco Use  ? Smoking status: Never  ?  Passive exposure: Never  ? Smokeless tobacco: Never  ?Vaping Use  ? Vaping Use: Never used  ?Substance Use Topics  ? Alcohol use: Yes  ? Drug use: Never  ? ? ? ?Allergies   ?Penicillins ? ? ?Review of Systems ?Review of Systems  ?HENT:  Positive for congestion and sinus pressure.   ?Respiratory:  Positive for cough, shortness of breath and wheezing.   ?All other systems reviewed and are negative. ? ? ?Physical Exam ?Triage Vital Signs ?ED Triage Vitals  ?Enc Vitals Group  ?   BP 05/15/21 0933 (!) 151/101  ?   Pulse Rate 05/15/21 0933 76  ?   Resp 05/15/21 0933 18  ?   Temp 05/15/21 0933 98.5 ?F (36.9 ?C)  ?   Temp Source 05/15/21 0933 Oral  ?   SpO2 05/15/21 0933  96 %  ?   Weight 05/15/21 0930 (!) 390 lb (176.9 kg)  ?   Height 05/15/21 0930 6\' 1"  (1.854 m)  ?   Head Circumference --   ?   Peak Flow --   ?   Pain Score 05/15/21 0930 0  ?   Pain Loc --   ?   Pain Edu? --   ?   Excl. in GC? --   ? ?No data found. ? ?Updated Vital Signs ?BP (!) 151/101 (BP Location: Left Arm)   Pulse 76   Temp 98.5 ?F (36.9 ?C) (Oral)   Resp 18   Ht 6\' 1"  (1.854 m)   Wt (!) 390 lb (176.9 kg)   SpO2 96%   BMI 51.45 kg/m?  ? ? ?Physical Exam ?Vitals and nursing note reviewed.  ?Constitutional:   ?   General: He is not in acute distress. ?   Appearance: Normal appearance. He is obese. He is not ill-appearing.  ?HENT:  ?   Head: Normocephalic and atraumatic.  ?   Right Ear:  Tympanic membrane, ear canal and external ear normal.  ?   Left Ear: Tympanic membrane, ear canal and external ear normal.  ?   Mouth/Throat:  ?   Mouth: Mucous membranes are moist.  ?   Pharynx: Oropharynx is clear.  ?Eyes:  ?   Extraocular Movements: Extraocular movements intact.  ?   Conjunctiva/sclera: Conjunctivae normal.  ?   Pupils: Pupils are equal, round, and reactive to light.  ?Cardiovascular:  ?   Rate and Rhythm: Normal rate and regular rhythm.  ?   Pulses: Normal pulses.  ?   Heart sounds: Normal heart sounds. No murmur heard. ?Pulmonary:  ?   Effort: Pulmonary effort is normal.  ?   Breath sounds: Normal breath sounds. No wheezing, rhonchi or rales.  ?   Comments: Infrequent nonproductive cough noted on exam ?Musculoskeletal:  ?   Cervical back: Normal range of motion and neck supple.  ?Skin: ?   General: Skin is warm and dry.  ?Neurological:  ?   General: No focal deficit present.  ?   Mental Status: He is alert and oriented to person, place, and time. Mental status is at baseline.  ? ? ? ?UC Treatments / Results  ?Labs ?(all labs ordered are listed, but only abnormal results are displayed) ?Labs Reviewed - No data to display ? ?EKG ? ? ?Radiology ?No results found. ? ?Procedures ?Procedures (including critical care time) ? ?Medications Ordered in UC ?Medications  ?methylPREDNISolone sodium succinate (SOLU-MEDROL) 125 mg/2 mL injection 125 mg (125 mg Intramuscular Given 05/15/21 1006)  ? ? ?Initial Impression / Assessment and Plan / UC Course  ?I have reviewed the triage vital signs and the nursing notes. ? ?Pertinent labs & imaging results that were available during my care of the patient were reviewed by me and considered in my medical decision making (see chart for details). ? ?  ? ?MDM: 1.  Cough-Rx'd Doxycycline, IM Solu-Medrol given once in clinic prior to discharge today; 2.  Chest congestion-Rx'd Prednisone in which patient will start tomorrow morning, Tuesday, 05/16/2021. Advised patient to  take medication as directed with food to completion.  Advised patient to take first Doxycycline now with food and second dose 8 hours later.  Advised patient tomorrow morning to take Prednisone with first dose of Doxycycline for the next 5 of 9 days.  Encouraged patient to increase daily water intake while taking these medications.  Advised  patient if symptoms worsen and/or unresolved please follow-up with PCP or here for further evaluation.  Patient discharged home, hemodynamically stable. ?Final Clinical Impressions(s) / UC Diagnoses  ? ?Final diagnoses:  ?Cough, unspecified type  ?Chest congestion  ? ? ? ?Discharge Instructions   ? ?  ?Advised patient to take medication as directed with food to completion.  Advised patient to take first Doxycycline now with food and second dose 8 hours later.  Advised patient tomorrow morning to take Prednisone with first dose of Doxycycline for the next 5 of 9 days.  Encouraged patient to increase daily water intake while taking these medications.  Advised patient if symptoms worsen and/or unresolved please follow-up with PCP or here for further evaluation. ? ? ? ? ?ED Prescriptions   ? ? Medication Sig Dispense Auth. Provider  ? doxycycline (VIBRAMYCIN) 100 MG capsule Take 1 capsule (100 mg total) by mouth 2 (two) times daily for 10 days. 20 capsule Trevor Ihaagan, Kerolos Nehme, FNP  ? predniSONE (DELTASONE) 20 MG tablet Take 4 tabs PO daily x 5 days. 20 tablet Trevor Ihaagan, Annica Marinello, FNP  ? ?  ? ?PDMP not reviewed this encounter. ?  ?Trevor IhaRagan, Asuna Peth, FNP ?05/15/21 1011 ? ?

## 2021-05-15 NOTE — ED Triage Notes (Signed)
Pt states that he has some nasal congestion, coughing, wheezing and sob. X3 days ? ?Pt states that he is vaccinated for covid.  ?Pt states that he has had flu vaccine.  ?

## 2021-05-15 NOTE — Discharge Instructions (Addendum)
Advised patient to take medication as directed with food to completion.  Advised patient to take first Doxycycline now with food and second dose 8 hours later.  Advised patient tomorrow morning to take Prednisone with first dose of Doxycycline for the next 5 of 9 days.  Encouraged patient to increase daily water intake while taking these medications.  Advised patient if symptoms worsen and/or unresolved please follow-up with PCP or here for further evaluation. ?

## 2021-07-20 ENCOUNTER — Ambulatory Visit: Payer: BC Managed Care – PPO | Admitting: Family Medicine

## 2021-07-20 ENCOUNTER — Encounter: Payer: Self-pay | Admitting: Family Medicine

## 2021-07-20 DIAGNOSIS — L818 Other specified disorders of pigmentation: Secondary | ICD-10-CM | POA: Diagnosis not present

## 2021-07-20 DIAGNOSIS — G4733 Obstructive sleep apnea (adult) (pediatric): Secondary | ICD-10-CM | POA: Diagnosis not present

## 2021-07-20 NOTE — Progress Notes (Signed)
Erik Roach - 46 y.o. male MRN [id number]  Date of birth: Nov 30, 1975  Subjective No chief complaint on file.   HPI Erik Roach is a 46 y.o. male here today for initial visit to establish care. Has history of OSA.  Has concern about discoloration on bilateral legs.  This has been present for several months.  He does have some mild swelling that worsens throughout the day, especially if standing for long periods.  No pain associated with this.  He denies chest pain, shortness of breath, or palpitations.  He does have OSA but is not using CPAP regularly.   ROS:  A comprehensive ROS was completed and negative except as noted per HPI  Allergies  Allergen Reactions   Penicillins     Testing done at [facility] as a child - was told he had an allergy- blood work     Past Medical History:  Diagnosis Date   Asthma    COVID-19 02/2020   vaccine x 2 - no booster   Hemorrhoids 06/20/2016   Microscopic hematuria 04/19/2015   Negative complete workup by Dr. Andrey Campanile as of 04/2015 Negative complete workup by Dr. Andrey Campanile as of 04/2015   Obesity, Class III, BMI 40-49.9 (morbid obesity) (HCC) 10/14/2012   Other male erectile dysfunction 07/29/2017   Sleep apnea, obstructive 10/11/2017   Vitamin D deficiency 01/08/2017    Past Surgical History:  Procedure Laterality Date   TONSILLECTOMY      Social History   Socioeconomic History   Marital status: Married    Spouse name: Not on file   Number of children: Not on file   Years of education: Not on file   Highest education level: Not on file  Occupational History   Not on file  Tobacco Use   Smoking status: Never    Passive exposure: Never   Smokeless tobacco: Never  Vaping Use   Vaping Use: Never used  Substance and Sexual Activity   Alcohol use: Yes   Drug use: Never   Sexual activity: Yes  Other Topics Concern   Not on file  Social History Narrative   Not on file   Social Determinants of Health   Financial Resource Strain:  Not on file  Food Insecurity: Not on file  Transportation Needs: Not on file  Physical Activity: Not on file  Stress: Not on file  Social Connections: Not on file    Family History  Problem Relation Age of Onset   Diabetes Mother    Diabetes Father     Health Maintenance  Topic Date Due   COVID-19 Vaccine (1) Never done   HIV Screening  Never done   Hepatitis C Screening  Never done   TETANUS/TDAP  07/27/2020   COLONOSCOPY (Pts 45-82yrs Insurance coverage will need to be confirmed)  Never done   INFLUENZA VACCINE  09/26/2021   HPV VACCINES  Aged Out     ----------------------------------------------------------------------------------------------------------------------------------------------------------------------------------------------------------------- Physical Exam BP 139/83 (BP Location: Left Arm, Patient Position: Sitting, Cuff Size: Large)   Pulse 69   Ht 6\' 2"  (1.88 m)   Wt (!) 407 lb (184.6 kg)   SpO2 95%   BMI 52.26 kg/m   Physical Exam Constitutional:      Appearance: Normal appearance.  Eyes:     General: No scleral icterus. Cardiovascular:     Rate and Rhythm: Normal rate and regular rhythm.     Pulses: Normal pulses.  Pulmonary:     Effort: Pulmonary effort is normal.  Breath sounds: Normal breath sounds.  Musculoskeletal:     Cervical back: Neck supple.  Skin:    Comments: Hemosiderin deposition bilaterally.  Trace edema, non-pitting  Neurological:     Mental Status: He is alert.    ------------------------------------------------------------------------------------------------------------------------------------------------------------------------------------------------------------------- Assessment and Plan  Sleep apnea, obstructive Recommend regular use of CPAP.   Hemosiderin pigmentation of skin Discussed that discoloration is due to iron leaching from broken capillaries due to venous insufficiency.  Discussed weight loss and  compression stockings to help with this.     No orders of the defined types were placed in this encounter.   No follow-ups on file.    This visit occurred during the SARS-CoV-2 public health emergency.  Safety protocols were in place, including screening questions prior to the visit, additional usage of staff PPE, and extensive cleaning of exam room while observing appropriate contact time as indicated for disinfecting solutions.

## 2021-07-20 NOTE — Patient Instructions (Signed)
Chronic Venous Insufficiency Chronic venous insufficiency is a condition where the leg veins cannot effectively pump blood from the legs to the heart. This happens when the vein walls are either stretched, weakened, or damaged, or when the valves inside the vein are damaged. With the right treatment, you should be able to continue with an active life. This condition is also called venous stasis. What are the causes? Common causes of this condition include: High blood pressure inside the veins (venous hypertension). Sitting or standing too long, causing increased blood pressure in the leg veins. A blood clot that blocks blood flow in a vein (deep vein thrombosis, DVT). Inflammation of a vein (phlebitis) that causes a blood clot to form. Tumors in the pelvis that cause blood to back up. What increases the risk? The following factors may make you more likely to develop this condition: Having a family history of this condition. Obesity. Pregnancy. Living without enough regular physical activity or exercise (sedentary lifestyle). Smoking. Having a job that requires long periods of standing or sitting in one place. Being a certain age. Women in their 40s and 50s and men in their 70s are more likely to develop this condition. What are the signs or symptoms? Symptoms of this condition include: Veins that are enlarged, bulging, or twisted (varicose veins). Skin breakdown or ulcers. Reddened skin or dark discoloration of skin on the leg between the knee and ankle. Brown, smooth, tight, and painful skin just above the ankle, usually on the inside of the leg (lipodermatosclerosis). Swelling of the legs. How is this diagnosed? This condition may be diagnosed based on: Your medical history. A physical exam. Tests, such as: A procedure that creates an image of a blood vessel and nearby organs and provides information about blood flow through the blood vessel (duplex ultrasound). A procedure that  tests blood flow (plethysmography). A procedure that looks at the veins using X-ray and dye (venogram). How is this treated? The goals of treatment are to help you return to an active life and to minimize pain or disability. Treatment depends on the severity of your condition, and it may include: Wearing compression stockings. These can help relieve symptoms and help prevent your condition from getting worse. However, they do not cure the condition. Sclerotherapy. This procedure involves an injection of a solution that shrinks damaged veins. Surgery. This may involve: Removing a diseased vein (vein stripping). Cutting off blood flow through the vein (laser ablation surgery). Repairing or reconstructing a valve within the affected vein. Follow these instructions at home:     Wear compression stockings as told by your health care provider. These stockings help to prevent blood clots and reduce swelling in your legs. Take over-the-counter and prescription medicines only as told by your health care provider. Stay active by exercising, walking, or doing different activities. Ask your health care provider what activities are safe for you and how much exercise you need. Drink enough fluid to keep your urine pale yellow. Do not use any products that contain nicotine or tobacco, such as cigarettes, e-cigarettes, and chewing tobacco. If you need help quitting, ask your health care provider. Keep all follow-up visits as told by your health care provider. This is important. Contact a health care provider if you: Have redness, swelling, or more pain in the affected area. See a red streak or line that goes up or down from the affected area. Have skin breakdown or skin loss in the affected area, even if the breakdown is small. Get   an injury in the affected area. Get help right away if: You get an injury and an open wound in the affected area. You have: Severe pain that does not get better with  medicine. Sudden numbness or weakness in the foot or ankle below the affected area. Trouble moving your foot or ankle. A fever. Worse or persistent symptoms. Chest pain. Shortness of breath. Summary Chronic venous insufficiency is a condition where the leg veins cannot effectively pump blood from the legs to the heart. Chronic venous insufficiency occurs when the vein walls become stretched, weakened, or damaged, or when valves within the vein are damaged. Treatment depends on how severe your condition is. It often involves wearing compression stockings and may involve having a procedure. Make sure you stay active by exercising, walking, or doing different activities. Ask your health care provider what activities are safe for you and how much exercise you need. This information is not intended to replace advice given to you by your health care provider. Make sure you discuss any questions you have with your health care provider. Document Revised: 04/26/2020 Document Reviewed: 04/26/2020 Elsevier Patient Education  2023 [employer]  

## 2021-07-20 NOTE — Assessment & Plan Note (Signed)
Discussed that discoloration is due to iron leaching from broken capillaries due to venous insufficiency.  Discussed weight loss and compression stockings to help with this.

## 2021-07-20 NOTE — Assessment & Plan Note (Signed)
Recommend regular use of CPAP.

## 2021-09-12 ENCOUNTER — Ambulatory Visit (INDEPENDENT_AMBULATORY_CARE_PROVIDER_SITE_OTHER): Payer: BC Managed Care – PPO | Admitting: Family Medicine

## 2021-09-12 ENCOUNTER — Encounter: Payer: Self-pay | Admitting: Family Medicine

## 2021-09-12 VITALS — BP 143/82 | HR 70 | Ht 74.0 in | Wt >= 6400 oz

## 2021-09-12 DIAGNOSIS — Z0000 Encounter for general adult medical examination without abnormal findings: Secondary | ICD-10-CM | POA: Diagnosis not present

## 2021-09-12 DIAGNOSIS — Z13220 Encounter for screening for lipoid disorders: Secondary | ICD-10-CM

## 2021-09-12 NOTE — Patient Instructions (Addendum)

## 2021-09-12 NOTE — Assessment & Plan Note (Signed)
Well adult Orders Placed This Encounter  Procedures  . COMPLETE METABOLIC PANEL WITH GFR  . CBC with Differential  . Lipid Panel w/reflex Direct LDL  . TSH  Screenings: Per lab orders.  Would like to defer colonoscopy until spring. Immunizations: Up-to-date Anticipatory guidance/risk factor reduction: Recommendations per AVS.

## 2021-09-12 NOTE — Progress Notes (Signed)
Erik Roach - 46 y.o. male MRN [id number]  Date of birth: 02-09-1976  Subjective Chief Complaint  Patient presents with   Annual Exam    HPI Erik Roach is a 46 year old male here today for annual exam.  Reports he is doing well at this time.  No concerns at this time.  He is a non-smoker.  Consumes alcohol occasionally.  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  Feels like diet could be better.  Immunizations are up-to-date.  He is due for colon cancer screening but would like to defer until spring  Review of Systems  Constitutional:  Negative for chills, fever, malaise/fatigue and weight loss.  HENT:  Negative for congestion, ear pain and sore throat.   Eyes:  Negative for blurred vision, double vision and pain.  Respiratory:  Negative for cough and shortness of breath.   Cardiovascular:  Negative for chest pain and palpitations.  Gastrointestinal:  Negative for abdominal pain, blood in stool, constipation, heartburn and nausea.  Genitourinary:  Negative for dysuria and urgency.  Musculoskeletal:  Negative for joint pain and myalgias.  Neurological:  Negative for dizziness and headaches.  Endo/Heme/Allergies:  Does not bruise/bleed easily.  Psychiatric/Behavioral:  Negative for depression. The patient is not nervous/anxious and does not have insomnia.       Past Medical History:  Diagnosis Date   Asthma    COVID-19 02/2020   vaccine x 2 - no booster   Hemorrhoids 06/20/2016   Microscopic hematuria 04/19/2015   Negative complete workup by Dr. Andrey Campanile as of 04/2015 Negative complete workup by Dr. Andrey Campanile as of 04/2015   Obesity, Class III, BMI 40-49.9 (morbid obesity) (HCC) 10/14/2012   Other male erectile dysfunction 07/29/2017   Sleep apnea, obstructive 10/11/2017   Vitamin D deficiency 01/08/2017    Past Surgical History:  Procedure Laterality Date    TONSILLECTOMY      Social History   Socioeconomic History   Marital status: Married    Spouse name: Not on file   Number of children: Not on file   Years of education: Not on file   Highest education level: Not on file  Occupational History   Occupation: Teacher  Tobacco Use   Smoking status: Never    Passive exposure: Never   Smokeless tobacco: Never  Vaping Use   Vaping Use: Never used  Substance and Sexual Activity   Alcohol use: Not Currently    Alcohol/week: 0.0 - 1.0 standard drinks of alcohol   Drug use: Never   Sexual activity: Yes    Partners: Female  Other Topics Concern   Not on file  Social History Narrative   Not on file   Social Determinants of Health   Financial Resource Strain: Not on file  Food Insecurity: Not on file  Transportation Needs: Not on file  Physical Activity: Not on file  Stress: Not on file  Social Connections: Not on file    Family History  Problem Relation Age of Onset   Diabetes Mother    Diabetes Father    Hypertension Paternal Grandfather     Health Maintenance  Topic Date Due   COVID-19 Vaccine (3 - Pfizer series) 10/27/2021 (Originally 07/13/2019)   COLONOSCOPY (Pts 45-58yrs Insurance coverage will need to be confirmed)  07/21/2022 (Originally 06/24/2021)   Hepatitis C Screening  07/21/2022 (Originally 06/25/1994)   HIV Screening  07/21/2022 (Originally 06/25/1991)  INFLUENZA VACCINE  09/26/2021   TETANUS/TDAP  02/26/2026   HPV VACCINES  Aged Out     ----------------------------------------------------------------------------------------------------------------------------------------------------------------------------------------------------------------- Physical Exam BP (!) 143/82 (BP Location: Left Arm, Patient Position: Sitting, Cuff Size: Large)   Pulse 70   Ht 6\' 2"  (1.88 m)   Wt (!) 409 lb (185.5 kg)   SpO2 95%   BMI 52.51 kg/m   Physical Exam Constitutional:      General: He is not in acute distress.     Appearance: He is obese.  HENT:     Head: Normocephalic and atraumatic.     Right Ear: Tympanic membrane and external ear normal.     Left Ear: Tympanic membrane and external ear normal.  Eyes:     General: No scleral icterus. Neck:     Thyroid: No thyromegaly.  Cardiovascular:     Rate and Rhythm: Normal rate and regular rhythm.     Heart sounds: Normal heart sounds.  Pulmonary:     Effort: Pulmonary effort is normal.     Breath sounds: Normal breath sounds.  Abdominal:     General: Bowel sounds are normal. There is no distension.     Palpations: Abdomen is soft.     Tenderness: There is no abdominal tenderness. There is no guarding.  Musculoskeletal:     Cervical back: Normal range of motion.  Lymphadenopathy:     Cervical: No cervical adenopathy.  Skin:    General: Skin is warm and dry.     Findings: No rash.  Neurological:     Mental Status: He is alert and oriented to person, place, and time.     Cranial Nerves: No cranial nerve deficit.     Motor: No abnormal muscle tone.  Psychiatric:        Mood and Affect: Mood normal.        Behavior: Behavior normal.     ------------------------------------------------------------------------------------------------------------------------------------------------------------------------------------------------------------------- Assessment and Plan  Well adult exam Well adult Orders Placed This Encounter  Procedures   COMPLETE METABOLIC PANEL WITH GFR   CBC with Differential   Lipid Panel w/reflex Direct LDL   TSH  Screenings: Per lab orders.  Would like to defer colonoscopy until spring. Immunizations: Up-to-date Anticipatory guidance/risk factor reduction: Recommendations per AVS.   No orders of the defined types were placed in this encounter.   No follow-ups on file.    This visit occurred during the SARS-CoV-2 public health emergency.  Safety protocols were in place, including screening questions prior to the  visit, additional usage of staff PPE, and extensive cleaning of exam room while observing appropriate contact time as indicated for disinfecting solutions.

## 2021-09-16 LAB — COMPLETE METABOLIC PANEL WITH GFR
AG Ratio: 1.8 (calc) (ref 1.0–2.5)
ALT: 30 U/L (ref 9–46)
AST: 18 U/L (ref 10–40)
Albumin: 4.5 g/dL (ref 3.6–5.1)
Alkaline phosphatase (APISO): 53 U/L (ref 36–130)
BUN: 16 mg/dL (ref 7–25)
CO2: 23 mmol/L (ref 20–32)
Calcium: 9.3 mg/dL (ref 8.6–10.3)
Chloride: 106 mmol/L (ref 98–110)
Creat: 1.03 mg/dL (ref 0.60–1.29)
Globulin: 2.5 g/dL (calc) (ref 1.9–3.7)
Glucose, Bld: 95 mg/dL (ref 65–99)
Potassium: 4.6 mmol/L (ref 3.5–5.3)
Sodium: 144 mmol/L (ref 135–146)
Total Bilirubin: 0.5 mg/dL (ref 0.2–1.2)
Total Protein: 7 g/dL (ref 6.1–8.1)
eGFR: 91 mL/min/{1.73_m2} (ref >=60)

## 2021-09-16 LAB — CBC WITH DIFFERENTIAL/PLATELET
Absolute Monocytes: 593 cells/uL (ref 200–950)
Basophils Absolute: 95 cells/uL (ref 0–200)
Basophils Relative: 1.1 %
Eosinophils Absolute: 456 cells/uL (ref 15–500)
Eosinophils Relative: 5.3 %
HCT: 48.1 % (ref 38.5–50.0)
Hemoglobin: 16.4 g/dL (ref 13.2–17.1)
Lymphs Abs: 2528 cells/uL (ref 850–3900)
MCH: 29.7 pg (ref 27.0–33.0)
MCHC: 34.1 g/dL (ref 32.0–36.0)
MCV: 87 fL (ref 80.0–100.0)
MPV: 10.5 fL (ref 7.5–12.5)
Monocytes Relative: 6.9 %
Neutro Abs: 4928 cells/uL (ref 1500–7800)
Neutrophils Relative %: 57.3 %
Platelets: 286 10*3/uL (ref 140–400)
RBC: 5.53 10*6/uL (ref 4.20–5.80)
RDW: 13 % (ref 11.0–15.0)
Total Lymphocyte: 29.4 %
WBC: 8.6 10*3/uL (ref 3.8–10.8)

## 2021-09-16 LAB — LIPID PANEL W/REFLEX DIRECT LDL
Cholesterol: 156 mg/dL (ref <200)
HDL: 46 mg/dL (ref >=40)
LDL Cholesterol (Calc): 87 mg/dL (calc)
Non-HDL Cholesterol (Calc): 110 mg/dL (calc) (ref <130)
Total CHOL/HDL Ratio: 3.4 (calc) (ref <5.0)
Triglycerides: 126 mg/dL (ref <150)

## 2021-09-16 LAB — TSH: TSH: 1.44 mIU/L (ref 0.40–4.50)

## 2021-10-04 ENCOUNTER — Ambulatory Visit
Admission: EM | Admit: 2021-10-04 | Discharge: 2021-10-04 | Disposition: A | Discharge status: Home / Self Care | Payer: BC Managed Care – PPO | Attending: Family Medicine | Admitting: Family Medicine

## 2021-10-04 ENCOUNTER — Encounter: Payer: Self-pay | Admitting: Emergency Medicine

## 2021-10-04 DIAGNOSIS — S80861A Insect bite (nonvenomous), right lower leg, initial encounter: Secondary | ICD-10-CM

## 2021-10-04 DIAGNOSIS — W57XXXA Bitten or stung by nonvenomous insect and other nonvenomous arthropods, initial encounter: Secondary | ICD-10-CM

## 2021-10-04 MED ORDER — MUPIROCIN 2 % EX OINT: 1.0000 | TOPICAL_OINTMENT | Freq: Two times a day (BID) | CUTANEOUS | 0 refills | Status: DC

## 2021-10-04 MED ORDER — DOXYCYCLINE HYCLATE 100 MG PO CAPS: ORAL_CAPSULE | ORAL | 0 refills | Status: DC

## 2021-10-04 NOTE — ED Triage Notes (Signed)
Patient states that he feels that he possibly got bit by something while cutting the grass x 2 days ago.  The area does have clear liquid that drains from it then refills.  No pain or itching, some redness.  Denies any medication for it.

## 2021-10-04 NOTE — Discharge Instructions (Signed)
Change bandage daily until healed.  Apply ace wrap daily until swelling has cleared.  If symptoms become significantly worse during the night or over the weekend, proceed to the local emergency room.

## 2021-10-04 NOTE — ED Provider Notes (Signed)
[facility]    CSN: [id number] Arrival date & time: 10/04/21  1450      History   Chief Complaint Chief Complaint  Patient presents with   Leg Pain    HPI Erik Roach is a 46 y.o. male.   While cutting grass two days ago, patient believes that he was stung by some type of insect on hie right medial ankle.  The area around the bite has become increasingly erythematous but not painful.  Last night a vesicle with clear fluid appeared.  He feels well otherwise.  The history is provided by the patient.    Past Medical History:  Diagnosis Date   Asthma    COVID-19 02/2020   vaccine x 2 - no booster   Hemorrhoids 06/20/2016   Microscopic hematuria 04/19/2015   Negative complete workup by Dr. Andrey Campanile as of 04/2015 Negative complete workup by Dr. 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 Authorizing Provider  Cholecalciferol (VITAMIN D) 125 MCG (5000 UT) CAPS Take by mouth.   Yes [provider]  doxycycline (VIBRAMYCIN) 100 MG capsule Take one cap PO Q12hr with food. 10/04/21  Yes Lattie Haw, MD  fexofenadine [facility] ALLERGY) 180 MG tablet Take 1 tablet (180 mg total) by mouth daily for 15 days. 12/17/20 10/04/21 Yes Trevor Iha, FNP   fluticasone (FLONASE) 50 MCG/ACT nasal spray Place into the nose.   Yes [provider]  mupirocin ointment (BACTROBAN) 2 % Apply 1 Application topically 2 (two) times daily. 10/04/21  Yes Lattie Haw, MD  Misc. Devices MISC Initiate auto CPAP at 5-15 cm. water pressure.  New mask and supplies. Patient not taking: Reported on 09/12/2021 08/16/17   [provider]    Family History Family History  Problem Relation Age of Onset   Diabetes Mother    Diabetes Father    Hypertension Paternal Grandfather     Social History Social History   Tobacco Use   Smoking status: Never    Passive exposure: Never   Smokeless tobacco: Never  Vaping Use   Vaping Use: Never used  Substance Use Topics   Alcohol use: Not Currently    Alcohol/week: 0.0 - 1.0 standard drinks of alcohol   Drug use: Never     Allergies   Penicillins   Review of Systems Review of Systems  Constitutional:  Negative for activity change, appetite change, chills, diaphoresis, fatigue and fever.  Musculoskeletal:  Negative for arthralgias and myalgias.  Skin:  Positive for rash.  Neurological:  Negative for headaches.  All other systems reviewed and are negative.    Physical Exam Triage Vital Signs ED Triage Vitals  Enc Vitals Group     BP 10/04/21 1601 132/81  Pulse Rate 10/04/21 1601 96     Resp 10/04/21 1601 18     Temp 10/04/21 1601 99.1 F (37.3 C)     Temp Source 10/04/21 1601 Oral     SpO2 10/04/21 1601 93 %     Weight --      Height --      Head Circumference --      Peak Flow --      Pain Score 10/04/21 1602 0     Pain Loc --      Pain Edu? --      Excl. in GC? --    No data found.  Updated Vital Signs BP 132/81 (BP Location: Right Arm)   Pulse 96   Temp 99.1 F (37.3 C) (Oral)   Resp 18   SpO2 93%   Visual Acuity Right Eye Distance:   Left Eye Distance:   Bilateral Distance:    Right Eye Near:   Left Eye Near:    Bilateral Near:     Physical  Exam Vitals and nursing note reviewed.  Constitutional:      General: He is not in acute distress. HENT:     Head: Normocephalic.  Eyes:     Conjunctiva/sclera: Conjunctivae normal.     Pupils: Pupils are equal, round, and reactive to light.  Cardiovascular:     Rate and Rhythm: Normal rate.  Pulmonary:     Effort: Pulmonary effort is normal.  Musculoskeletal:     Right lower leg: Swelling present. No tenderness. No edema.     Left lower leg: No edema.       Legs:     Comments: Right lower leg just above medial ankle has approximately 6cm diameter round erythematous macule with vesicle containing clear fluid at one edge.  No induration or fluctuance.  No drainage or weeping.  Mild swelling present extending to foot.  Skin:    General: Skin is warm and dry.  Neurological:     Mental Status: He is alert.     UC Treatments / Results  Labs (all labs ordered are listed, but only abnormal results are displayed) Labs Reviewed - No data to display  EKG   Radiology No results found.  Procedures Procedures (including critical care time)  Medications Ordered in UC Medications - No data to display  Initial Impression / Assessment and Plan / UC Course  I have reviewed the triage vital signs and the nursing notes.  Pertinent labs & imaging results that were available during my care of the patient were reviewed by me and considered in my medical decision making (see chart for details).    Likely early cellulitis.  Begin Bactroban ointment and doxycycline. Applied ace wrap in graduated compression fashion. Followup with Family Doctor if not improved in one week.   Final Clinical Impressions(s) / UC Diagnoses   Final diagnoses:  Insect bite of right lower leg, initial encounter     Discharge Instructions      Change bandage daily until healed.  Apply ace wrap daily until swelling has cleared.  If symptoms become significantly worse during the night or over the weekend,  proceed to the local emergency room.     ED Prescriptions     Medication Sig Dispense Auth. Provider   mupirocin ointment (BACTROBAN) 2 % Apply 1 Application topically 2 (two) times daily. 15 g Lattie Haw, MD   doxycycline (VIBRAMYCIN) 100 MG capsule Take one cap PO Q12hr with food. 10 capsule  Lattie Haw, MD         Lattie Haw, MD 10/06/21 [phone]

## 2021-12-28 ENCOUNTER — Encounter: Payer: Self-pay | Admitting: Family Medicine

## 2021-12-28 ENCOUNTER — Ambulatory Visit: Payer: BC Managed Care – PPO | Admitting: Family Medicine

## 2021-12-28 VITALS — BP 146/83 | HR 64 | Ht 74.0 in | Wt >= 6400 oz

## 2021-12-28 DIAGNOSIS — J40 Bronchitis, not specified as acute or chronic: Secondary | ICD-10-CM | POA: Diagnosis not present

## 2021-12-28 DIAGNOSIS — J329 Chronic sinusitis, unspecified: Secondary | ICD-10-CM | POA: Diagnosis not present

## 2021-12-28 MED ORDER — METHYLPREDNISOLONE 4 MG PO TBPK: ORAL_TABLET | ORAL | 0 refills | Status: DC

## 2021-12-28 MED ORDER — DOXYCYCLINE HYCLATE 100 MG PO TABS: 100.0000 mg | ORAL_TABLET | Freq: Two times a day (BID) | ORAL | 0 refills | Status: AC

## 2021-12-28 MED ORDER — BENZONATATE 100 MG PO CAPS: 100.0000 mg | ORAL_CAPSULE | Freq: Three times a day (TID) | ORAL | 0 refills | Status: AC | PRN

## 2021-12-28 NOTE — Progress Notes (Signed)
Acute Office Visit  Subjective:     Patient ID: RORI MCAVOY, male    DOB: 04/07/1975, 46 y.o.   MRN: [id number]  Chief Complaint  Patient presents with   Cough    HPI Patient is in today for cough and congestion for a few days. He denies any recent sick contacts. Says every year he gets bronchitis and feels like this is the same.   Review of Systems  Constitutional:  Negative for chills and fever.  HENT:  Positive for congestion.   Respiratory:  Positive for cough. Negative for shortness of breath.   Cardiovascular:  Negative for chest pain.  Neurological:  Negative for headaches.        Objective:    BP (!) 146/83   Pulse 64   Ht 6\' 2"  (1.88 m)   Wt (!) 409 lb (185.5 kg)   SpO2 98%   BMI 52.51 kg/m    Physical Exam Vitals and nursing note reviewed.  Constitutional:      General: He is not in acute distress.    Appearance: Normal appearance.  HENT:     Head: Normocephalic and atraumatic.     Right Ear: External ear normal.     Left Ear: External ear normal.     Nose: Congestion present.     Mouth/Throat:     Pharynx: Posterior oropharyngeal erythema present.  Eyes:     Conjunctiva/sclera: Conjunctivae normal.  Cardiovascular:     Rate and Rhythm: Normal rate and regular rhythm.  Pulmonary:     Effort: Pulmonary effort is normal.     Breath sounds: Normal breath sounds.     Comments: Coughing during exam Neurological:     General: No focal deficit present.     Mental Status: He is alert and oriented to person, place, and time.  Psychiatric:        Mood and Affect: Mood normal.        Behavior: Behavior normal.        Thought Content: Thought content normal.        Judgment: Judgment normal.     No results found for any visits on 12/28/21.      Assessment & Plan:   Problem List Items Addressed This Visit       Respiratory   Sinobronchitis - Primary    - pt has congestion associated with productive cough. Lung exam clear  - will go  ahead and give medrol dose pack to help decrease inflammation. Recommend rest, fluids, increasing vit c and d as well as zinc - with the weekend coming up, provided pt with prescription of doxycycline to take if symptoms don't resolve over the next few days or his symptoms start to get worse  - hesitant at this time to give codeine based cough syrup due to hx of sleep apnea and concern for respiratory depression. - recommended flu and covid test, but patient refused.      Relevant Medications   methylPREDNISolone (MEDROL DOSEPAK) 4 MG TBPK tablet   doxycycline (VIBRA-TABS) 100 MG tablet   benzonatate (TESSALON) 100 MG capsule    Meds ordered this encounter  Medications   methylPREDNISolone (MEDROL DOSEPAK) 4 MG TBPK tablet    Sig: Follow instructions on tablet pack    Dispense:  21 tablet    Refill:  0   doxycycline (VIBRA-TABS) 100 MG tablet    Sig: Take 1 tablet (100 mg total) by mouth 2 (two) times daily for 7 days.  Dispense:  14 tablet    Refill:  0   benzonatate (TESSALON) 100 MG capsule    Sig: Take 1 capsule (100 mg total) by mouth 3 (three) times daily as needed for up to 7 days for cough.    Dispense:  21 capsule    Refill:  0    Return if symptoms worsen or fail to improve.  Owens Loffler, DO

## 2021-12-28 NOTE — Assessment & Plan Note (Addendum)
-   pt has congestion associated with productive cough. Lung exam clear  - will go ahead and give medrol dose pack to help decrease inflammation. Recommend rest, fluids, increasing vit c and d as well as zinc - with the weekend coming up, provided pt with prescription of doxycycline to take if symptoms don't resolve over the next few days or his symptoms start to get worse  - hesitant at this time to give codeine based cough syrup due to hx of sleep apnea and concern for respiratory depression. - recommended flu and covid test, but patient refused.

## 2022-01-05 ENCOUNTER — Ambulatory Visit
Admission: EM | Admit: 2022-01-05 | Discharge: 2022-01-05 | Disposition: A | Discharge status: Home / Self Care | Payer: BC Managed Care – PPO | Attending: Family Medicine | Admitting: Family Medicine

## 2022-01-05 DIAGNOSIS — S0501XA Injury of conjunctiva and corneal abrasion without foreign body, right eye, initial encounter: Secondary | ICD-10-CM | POA: Diagnosis not present

## 2022-01-05 MED ORDER — KETOROLAC TROMETHAMINE 0.5 % OP SOLN: 1.0000 [drp] | Freq: Four times a day (QID) | OPHTHALMIC | 0 refills | Status: AC

## 2022-01-05 MED ORDER — SULFACETAMIDE SODIUM 10 % OP SOLN: 1.0000 [drp] | OPHTHALMIC | 0 refills | Status: AC

## 2022-01-05 NOTE — Discharge Instructions (Signed)
May take Ibuprofen 200mg , 4 tabs every 8 hours with food as needed.  If symptoms become significantly worse during the night or over the weekend, proceed to the local emergency room.

## 2022-01-05 NOTE — ED Provider Notes (Signed)
[name]    CSN: [id number] Arrival date & time: 01/05/22  4098      History   Chief Complaint Chief Complaint  Patient presents with   Eye Problem    HPI Erik Roach is a 46 y.o. male.   Patient reports that he developed right eye pain and photophobia last night after removing his contact lens.  He recalls no foreign body to his right eye.  The history is provided by the patient.  Eye Pain This is a new problem. The current episode started yesterday. The problem occurs constantly. The problem has not changed since onset.Exacerbated by: eye movement. Nothing relieves the symptoms. He has tried nothing for the symptoms.    Past Medical History:  Diagnosis Date   Asthma    COVID-19 02/2020   vaccine x 2 - no booster   Hemorrhoids 06/20/2016   Microscopic hematuria 04/19/2015   Negative complete workup by Dr. Andrey Campanile as of 04/2015 Negative complete workup by Dr. 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 Authorizing Provider  ketorolac (ACULAR) 0.5 % ophthalmic solution Place 1 drop into the right eye 4 (four) times daily. 01/05/22  Yes Lattie Haw, MD  sulfacetamide (BLEPH-10) 10 % ophthalmic solution  Place 1-2 drops into the right eye every 3 (three) hours. 01/05/22  Yes Lattie Haw, MD  Cholecalciferol (VITAMIN D) 125 MCG (5000 UT) CAPS Take by mouth.    [provider]  fexofenadine (ALLEGRA ALLERGY) 180 MG tablet Take 1 tablet (180 mg total) by mouth daily for 15 days. 12/17/20 10/04/21  Trevor Iha, FNP  fluticasone (FLONASE) 50 MCG/ACT nasal spray Place into the nose.    [provider]  methylPREDNISolone (MEDROL DOSEPAK) 4 MG TBPK tablet Follow instructions on tablet pack 12/28/21   Charlton Amor, DO    Family History Family History  Problem Relation Age of Onset   Diabetes Mother    Diabetes Father    Hypertension Paternal Grandfather     Social History Social History   Tobacco Use   Smoking status: Never    Passive exposure: Never   Smokeless tobacco: Never  Vaping Use   Vaping Use: Never used  Substance Use Topics   Alcohol use: Not Currently   Drug use: Never     Allergies   Penicillins   Review of Systems Review of Systems  Eyes:  Positive for photophobia, pain and redness. Negative for discharge, itching and visual disturbance.  All other systems reviewed and are negative.    Physical Exam Triage Vital Signs ED Triage Vitals  Enc Vitals Group     BP 01/05/22 1027 135/86     Pulse Rate 01/05/22 1027 63  Resp 01/05/22 1027 20     Temp 01/05/22 1027 97.6 F (36.4 C)     Temp Source 01/05/22 1027 Oral     SpO2 01/05/22 1027 97 %     Weight 01/05/22 1024 (!) 400 lb (181.4 kg)     Height 01/05/22 1024 6' (1.829 m)     Head Circumference --      Peak Flow --      Pain Score 01/05/22 1024 3     Pain Loc --      Pain Edu? --      Excl. in GC? --    No data found.  Updated Vital Signs BP 135/86 (BP Location: Left Arm)   Pulse 63   Temp 97.6 F (36.4 C) (Oral)   Resp 20   Ht 6' (1.829 m)   Wt (!) 181.4 kg   SpO2 97%   BMI 54.25 kg/m   Visual Acuity Right Eye Distance: 20/15 Left Eye  Distance: 20/13 Bilateral Distance: 20/15  Right Eye Near:   Left Eye Near:    Bilateral Near:     Physical Exam Vitals and nursing note reviewed.  Constitutional:      General: He is not in acute distress. HENT:     Head: Normocephalic.  Eyes:     General: Lids are normal. Lids are everted, no foreign bodies appreciated. Vision grossly intact. Gaze aligned appropriately.        Right eye: No foreign body, discharge or hordeolum.     Extraocular Movements: Extraocular movements intact.     Conjunctiva/sclera:     Right eye: Right conjunctiva is injected. No chemosis, exudate or hemorrhage.    Pupils: Pupils are equal, round, and reactive to light.      Comments: Mild right photophobia present.  Right fundi normal. Fluorescein to right eye reveals superficial corneal uptake as noted on diagram.    Cardiovascular:     Rate and Rhythm: Normal rate.  Pulmonary:     Effort: Pulmonary effort is normal.  Lymphadenopathy:     Cervical: No cervical adenopathy.  Skin:    General: Skin is warm and dry.  Neurological:     Mental Status: He is alert.      UC Treatments / Results  Labs (all labs ordered are listed, but only abnormal results are displayed) Labs Reviewed - No data to display  EKG   Radiology No results found.  Procedures Procedures (including critical care time)  Medications Ordered in UC Medications - No data to display  Initial Impression / Assessment and Plan / UC Course  I have reviewed the triage vital signs and the nursing notes.  Pertinent labs & imaging results that were available during my care of the patient were reviewed by me and considered in my medical decision making (see chart for details).    Begin sulfacetamide ophth suspension and Acular. Followup with ophthalmologist if not improved about 3 days.  Final Clinical Impressions(s) / UC Diagnoses   Final diagnoses:  Abrasion of right cornea, initial encounter     Discharge  Instructions      May take Ibuprofen 200mg , 4 tabs every 8 hours with food as needed.  If symptoms become significantly worse during the night or over the weekend, proceed to the local emergency room.     ED Prescriptions     Medication Sig Dispense Auth. Provider   sulfacetamide (BLEPH-10) 10 % ophthalmic solution Place 1-2 drops into the right eye every 3 (three)  hours. 5 mL Lattie Haw, MD   ketorolac (ACULAR) 0.5 % ophthalmic solution Place 1 drop into the right eye 4 (four) times daily. 3 mL Lattie Haw, MD         Lattie Haw, MD 01/07/22 1300

## 2022-01-05 NOTE — ED Triage Notes (Signed)
Pt presents to Urgent Care with c/o R eye pain and redness since last night. Also reports photosensitivity. States pain began after feeling as if something was under his contact lens.

## 2022-01-06 ENCOUNTER — Telehealth: Payer: Self-pay | Admitting: Emergency Medicine

## 2022-01-06 NOTE — Telephone Encounter (Signed)
LMTRC.  Advised if doing well to disregard the call, any questions or concerns feel free to give the office a call. 

## 2022-04-14 ENCOUNTER — Telehealth: Payer: BC Managed Care – PPO | Admitting: Family Medicine

## 2022-04-14 DIAGNOSIS — J069 Acute upper respiratory infection, unspecified: Secondary | ICD-10-CM | POA: Diagnosis not present

## 2022-04-14 MED ORDER — DOXYCYCLINE HYCLATE 100 MG PO TABS: 100.0000 mg | ORAL_TABLET | Freq: Two times a day (BID) | ORAL | 0 refills | Status: AC

## 2022-04-14 MED ORDER — BENZONATATE 200 MG PO CAPS: 200.0000 mg | ORAL_CAPSULE | Freq: Two times a day (BID) | ORAL | 0 refills | Status: DC | PRN

## 2022-04-14 NOTE — Progress Notes (Signed)

## 2023-04-24 ENCOUNTER — Ambulatory Visit (INDEPENDENT_AMBULATORY_CARE_PROVIDER_SITE_OTHER): Payer: 59 | Admitting: Family Medicine

## 2023-04-24 ENCOUNTER — Encounter: Payer: Self-pay | Admitting: Family Medicine

## 2023-04-24 ENCOUNTER — Ambulatory Visit: Payer: Self-pay | Admitting: Family Medicine

## 2023-04-24 VITALS — BP 147/88 | HR 82 | Temp 98.0°F | Ht 72.0 in | Wt >= 6400 oz

## 2023-04-24 DIAGNOSIS — R6889 Other general symptoms and signs: Secondary | ICD-10-CM

## 2023-04-24 DIAGNOSIS — J0100 Acute maxillary sinusitis, unspecified: Secondary | ICD-10-CM | POA: Insufficient documentation

## 2023-04-24 LAB — POCT INFLUENZA A/B
Influenza A, POC: NEGATIVE
Influenza B, POC: NEGATIVE

## 2023-04-24 LAB — POC COVID19 BINAXNOW: SARS Coronavirus 2 Ag: NEGATIVE

## 2023-04-24 MED ORDER — DOXYCYCLINE HYCLATE 100 MG PO TABS: 100.0000 mg | ORAL_TABLET | Freq: Two times a day (BID) | ORAL | 0 refills | Status: DC

## 2023-04-24 MED ORDER — PREDNISONE 20 MG PO TABS: 20.0000 mg | ORAL_TABLET | Freq: Two times a day (BID) | ORAL | 0 refills | Status: AC

## 2023-04-24 NOTE — Assessment & Plan Note (Signed)
 Point-of-care testing for influenza and COVID are negative.  Recommend supportive care with increase fluids.  Treating aggressively with course of doxycycline and prednisone burst.  Red flags reviewed.  Contact clinic if not improving as anticipated.

## 2023-04-24 NOTE — Telephone Encounter (Signed)
  Chief Complaint: sinus pain and congestion Symptoms: cough, ear pressure bilateral, sore throat Frequency: x 1 week Pertinent Negatives: Patient denies fever, SOB Disposition: [] ED /[] Urgent Care (no appt availability in office) / [x] Appointment(In office/virtual)/ []  [facility]/ [] Home Care/ [] Refused Recommended Disposition /[] [facility]/ []  Follow-up with PCP Additional Notes: Patient states he has been treating at home with Sudafed and Tylenol.  Copied from CRM [id number]. Topic: Appointments - Appointment Scheduling >> Apr 24, 2023  8:58 AM Gery Pray wrote: Patient/patient representative is calling to schedule an appointment. Refer to attachments for appointment information. Patient calling presenting with congestion, cough, mucus, headaches, ear pressure, sneezing, and insomnia. Patient states that symptoms are about a 8. In addition, patient states that the pressure from headache was causing his teeth to hurt due to how intense it was. Reason for Disposition  Earache  Answer Assessment - Initial Assessment Questions 1. LOCATION: "Where does it hurt?"      Nose, middle of forehead and down to the cheeks. Last night it was so bad it was in "my teeth".  2. ONSET: "When did the sinus pain start?"  (e.g., hours, days)      Started last week with runny  nose and sneezing, sinus pain and pressure x 2 days.  3. SEVERITY: "How bad is the pain?"   (Scale 1-10; mild, moderate or severe)   - MILD (1-3): doesn't interfere with normal activities    - MODERATE (4-7): interferes with normal activities (e.g., work or school) or awakens from sleep   - SEVERE (8-10): excruciating pain and patient unable to do any normal activities        7/10. Patient states he has not taken any medications this morning.  4. RECURRENT SYMPTOM: "Have you ever had sinus problems before?" If Yes, ask: "When was the last time?" and "What happened that time?"      Yes, probably last year. He states he  typically gets one in the fall and spring.  5. NASAL CONGESTION: "Is the nose blocked?" If Yes, ask: "Can you open it or must you breathe through your mouth?"     Yes, and states he has been having to mouth breathe. He states he has not been able to wear CPAP due to nasal congestion.  6. NASAL DISCHARGE: "Do you have discharge from your nose?" If so ask, "What color?"     Yes, runny nose with mostly clear to yellow mucus.  7. FEVER: "Do you have a fever?" If Yes, ask: "What is it, how was it measured, and when did it start?"      Denies.  8. OTHER SYMPTOMS: "Do you have any other symptoms?" (e.g., sore throat, cough, earache, difficulty breathing)     Cough, ear pressure bilateral, sore throat.  Protocols used: Sinus Pain or Congestion-A-AH

## 2023-04-24 NOTE — Patient Instructions (Signed)

## 2023-04-24 NOTE — Progress Notes (Signed)
 Erik Roach - 48 y.o. male MRN [id number]  Date of birth: [birth year]  Subjective Chief Complaint  Patient presents with   URI    HPI Erik Roach is a 48 y.o. male here today with complaint of sinus pain and pressure, nasal congestion, headache and mild ear pressure.  Symptoms started about a week ago but significantly worsened over the past couple of days.  He is using over-the-counter medications including Sudafed without much relief.  He is drinking plenty of fluids.  ROS:  A comprehensive ROS was completed and negative except as noted per HPI    Allergies  Allergen Reactions   Penicillins     Testing done at [facility] as a child - was told he had an allergy- blood work     Past Medical History:  Diagnosis Date   Asthma    COVID-19 02/2020   vaccine x 2 - no booster   Hemorrhoids 06/20/2016   Microscopic hematuria 04/19/2015   Negative complete workup by Dr. Andrey Campanile as of 04/2015 Negative complete workup by Dr. Andrey Campanile as of 04/2015   Obesity, Class III, BMI 40-49.9 (morbid obesity) (HCC) 10/14/2012   Other male erectile dysfunction 07/29/2017   Sleep apnea, obstructive 10/11/2017   Vitamin D deficiency 01/08/2017    Past Surgical History:  Procedure Laterality Date   TONSILLECTOMY      Social History   Socioeconomic History   Marital status: Married    Spouse name: Not on file   Number of children: Not on file   Years of education: Not on file   Highest education level: Not on file  Occupational History   Occupation: Teacher  Tobacco Use   Smoking status: Never    Passive exposure: Never   Smokeless tobacco: Never  Vaping Use   Vaping status: Never Used  Substance and Sexual Activity   Alcohol use: Not Currently   Drug use: Never   Sexual activity: Not on file  Other Topics Concern   Not on file  Social History Narrative   Not on file   Social Drivers of Health   Financial Resource Strain: Not on file  Food Insecurity: Not on file   Transportation Needs: Not on file  Physical Activity: Not on file  Stress: Not on file  Social Connections: Unknown (06/30/2021)   Received from [facility]   Social Network    Social Network: Not on file    Family History  Problem Relation Age of Onset   Diabetes Mother    Diabetes Father    Hypertension Paternal Grandfather     Health Maintenance  Topic Date Due   HIV Screening  Never done   Hepatitis C Screening  Never done   Colonoscopy  Never done   INFLUENZA VACCINE  05/27/2023 (Originally 09/27/2022)   COVID-19 Vaccine (3 - 2024-25 season) 10/22/2023 (Originally 10/28/2022)   DTaP/Tdap/Td (2 - Td or Tdap) 02/26/2026   HPV VACCINES  Aged Out     ----------------------------------------------------------------------------------------------------------------------------------------------------------------------------------------------------------------- Physical Exam BP (!) 147/88 (BP Location: Left Arm, Patient Position: Sitting, Cuff Size: Large)   Pulse 82   Temp 98 F (36.7 C) (Oral)   Ht 6' (1.829 m)   Wt (!) 421 lb (191 kg)   SpO2 95%   BMI 57.10 kg/m   Physical Exam Constitutional:      Appearance: Normal appearance.  HENT:     Head: Normocephalic and atraumatic.     Right Ear: Tympanic membrane normal.  Left Ear: Tympanic membrane normal.     Nose:     Comments: Bilateral maxillary sinus tenderness. Eyes:     General: No scleral icterus. Cardiovascular:     Rate and Rhythm: Normal rate and regular rhythm.  Pulmonary:     Effort: Pulmonary effort is normal.     Breath sounds: Normal breath sounds.  Musculoskeletal:     Cervical back: Neck supple.  Neurological:     Mental Status: He is alert.  Psychiatric:        Mood and Affect: Mood normal.        Behavior: Behavior normal.      ------------------------------------------------------------------------------------------------------------------------------------------------------------------------------------------------------------------- Assessment and Plan  Acute maxillary sinusitis Point-of-care testing for influenza and COVID are negative.  Recommend supportive care with increase fluids.  Treating aggressively with course of doxycycline and prednisone burst.  Red flags reviewed.  Contact clinic if not improving as anticipated.   Meds ordered this encounter  Medications   doxycycline (VIBRA-TABS) 100 MG tablet    Sig: Take 1 tablet (100 mg total) by mouth 2 (two) times daily.    Dispense:  20 tablet    Refill:  0   predniSONE (DELTASONE) 20 MG tablet    Sig: Take 1 tablet (20 mg total) by mouth 2 (two) times daily with a meal for 5 days.    Dispense:  10 tablet    Refill:  0    No follow-ups on file.    This visit occurred during the SARS-CoV-2 public health emergency.  Safety protocols were in place, including screening questions prior to the visit, additional usage of staff PPE, and extensive cleaning of exam room while observing appropriate contact time as indicated for disinfecting solutions.

## 2023-04-25 NOTE — Telephone Encounter (Signed)
 Seen in office 04/24/23 with Everrett Coombe, DO>

## 2023-05-06 ENCOUNTER — Encounter: Payer: Self-pay | Admitting: Family Medicine

## 2023-05-08 NOTE — Telephone Encounter (Signed)
 Patient scheduled with Tandy Gaw for evaluation of cough

## 2023-05-10 ENCOUNTER — Encounter: Payer: Self-pay | Admitting: Physician Assistant

## 2023-05-10 ENCOUNTER — Ambulatory Visit (INDEPENDENT_AMBULATORY_CARE_PROVIDER_SITE_OTHER): Admitting: Physician Assistant

## 2023-05-10 ENCOUNTER — Ambulatory Visit

## 2023-05-10 VITALS — BP 139/83 | HR 84 | Ht 72.0 in | Wt >= 6400 oz

## 2023-05-10 DIAGNOSIS — J329 Chronic sinusitis, unspecified: Secondary | ICD-10-CM

## 2023-05-10 DIAGNOSIS — R053 Chronic cough: Secondary | ICD-10-CM | POA: Diagnosis not present

## 2023-05-10 DIAGNOSIS — J40 Bronchitis, not specified as acute or chronic: Secondary | ICD-10-CM

## 2023-05-10 DIAGNOSIS — J189 Pneumonia, unspecified organism: Secondary | ICD-10-CM | POA: Diagnosis not present

## 2023-05-10 DIAGNOSIS — R059 Cough, unspecified: Secondary | ICD-10-CM

## 2023-05-10 MED ORDER — AZITHROMYCIN 250 MG PO TABS: ORAL_TABLET | ORAL | 0 refills | Status: AC

## 2023-05-10 MED ORDER — IPRATROPIUM-ALBUTEROL 0.5-2.5 (3) MG/3ML IN SOLN: 3.0000 mL | RESPIRATORY_TRACT | 1 refills | Status: AC | PRN

## 2023-05-10 MED ORDER — METHYLPREDNISOLONE 4 MG PO TBPK: ORAL_TABLET | ORAL | 0 refills | Status: AC

## 2023-05-10 NOTE — Progress Notes (Signed)
 There are patches concerning for pneumonia. Start zpak and prednisone. Let us know if not feeling any better or worsening.

## 2023-05-10 NOTE — Patient Instructions (Signed)
 Start zpak and medrol dose pack Duoneb every 4-6 hours CXR today will call with results  Acute Bronchitis, Adult  Acute bronchitis is sudden inflammation of the main airways (bronchi) that come off the windpipe (trachea) in the lungs. The swelling causes the airways to get smaller and make more mucus than normal. This can make it hard to breathe and can cause coughing or noisy breathing (wheezing). Acute bronchitis may last several weeks. The cough may last longer. Allergies, asthma, and exposure to smoke may make the condition worse. What are the causes? This condition can be caused by germs and by substances that irritate the lungs, including: Cold and flu viruses. The most common cause of this condition is the virus that causes the common cold. Bacteria. This is less common. Breathing in substances that irritate the lungs, including: Smoke from cigarettes and other forms of tobacco. Dust and pollen. Fumes from household cleaning products, gases, or burned fuel. Indoor or outdoor air pollution. What increases the risk? The following factors may make you more likely to develop this condition: A weak body's defense system, also called the immune system. A condition that affects your lungs and breathing, such as asthma. What are the signs or symptoms? Common symptoms of this condition include: Coughing. This may bring up clear, yellow, or green mucus from your lungs (sputum). Wheezing. Runny or stuffy nose. Having too much mucus in your lungs (chest congestion). Shortness of breath. Aches and pains, including sore throat or chest. How is this diagnosed? This condition is usually diagnosed based on: Your symptoms and medical history. A physical exam. You may also have other tests, including tests to rule out other conditions, such as pneumonia. These tests include: A test of lung function. Test of a mucus sample to look for the presence of bacteria. Tests to check the oxygen level  in your blood. Blood tests. Chest X-ray. How is this treated? Most cases of acute bronchitis clear up over time without treatment. Your health care provider may recommend: Drinking more fluids to help thin your mucus so it is easier to cough up. Taking inhaled medicine (inhaler) to improve air flow in and out of your lungs. Using a vaporizer or a humidifier. These are machines that add water to the air to help you breathe better. Taking a medicine that thins mucus and clears congestion (expectorant). Taking a medicine that prevents or stops coughing (cough suppressant). It is not common to take an antibiotic medicine for this condition. Follow these instructions at home:  Take over-the-counter and prescription medicines only as told by your health care provider. Use an inhaler, vaporizer, or humidifier as told by your health care provider. Take two teaspoons (10 mL) of honey at bedtime to lessen coughing at night. Drink enough fluid to keep your urine pale yellow. Do not use any products that contain nicotine or tobacco. These products include cigarettes, chewing tobacco, and vaping devices, such as e-cigarettes. If you need help quitting, ask your health care provider. Get plenty of rest. Return to your normal activities as told by your health care provider. Ask your health care provider what activities are safe for you. Keep all follow-up visits. This is important. How is this prevented? To lower your risk of getting this condition again: Wash your hands often with soap and water for at least 20 seconds. If soap and water are not available, use hand sanitizer. Avoid contact with people who have cold symptoms. Try not to touch your mouth, nose, or eyes  with your hands. Avoid breathing in smoke or chemical fumes. Breathing smoke or chemical fumes will make your condition worse. Get the flu shot every year. Contact a health care provider if: Your symptoms do not improve after 2  weeks. You have trouble coughing up the mucus. Your cough keeps you awake at night. You have a fever. Get help right away if you: Cough up blood. Feel pain in your chest. Have severe shortness of breath. Faint or keep feeling like you are going to faint. Have a severe headache. Have a fever or chills that get worse. These symptoms may represent a serious problem that is an emergency. Do not wait to see if the symptoms will go away. Get medical help right away. Call your local emergency services (911 in the [place] Do not drive yourself to the hospital. Summary Acute bronchitis is inflammation of the main airways (bronchi) that come off the windpipe (trachea) in the lungs. The swelling causes the airways to get smaller and make more mucus than normal. Drinking more fluids can help thin your mucus so it is easier to cough up. Take over-the-counter and prescription medicines only as told by your health care provider. Do not use any products that contain nicotine or tobacco. These products include cigarettes, chewing tobacco, and vaping devices, such as e-cigarettes. If you need help quitting, ask your health care provider. Contact a health care provider if your symptoms do not improve after 2 weeks. This information is not intended to replace advice given to you by your health care provider. Make sure you discuss any questions you have with your health care provider. Document Revised: 05/25/2021 Document Reviewed: 06/15/2020 Elsevier Patient Education  2024 [employer].

## 2023-05-13 ENCOUNTER — Encounter: Payer: Self-pay | Admitting: Physician Assistant

## 2023-05-13 MED ORDER — BENZONATATE 200 MG PO CAPS: 200.0000 mg | ORAL_CAPSULE | Freq: Three times a day (TID) | ORAL | 0 refills | Status: AC | PRN

## 2023-05-13 MED ORDER — HYDROCOD POLI-CHLORPHE POLI ER 10-8 MG/5ML PO SUER: 5.0000 mL | Freq: Two times a day (BID) | ORAL | 0 refills | Status: AC | PRN

## 2023-05-13 NOTE — Progress Notes (Signed)
 Acute Office Visit  Subjective:     Patient ID: Erik Roach, male    DOB: 1975/08/28, 48 y.o.   MRN: [id number]  Chief Complaint  Patient presents with   Cough    HPI Patient is in today for persistent cough and chest congestion. He was seen on 2/26 by PCP for flu like symptoms/sinsusitis. He was given doxycycline and prednisone. He does feel better with his sinus symptoms but continues to have a lot of chest cough and congestion. No wheezing. He has hx of asthma as a kid. He has been using his kids albuterol solution which has been helpful. No fever, body aches or chills.   ROS See HPI.      Objective:    BP 139/83 (BP Location: Left Arm, Patient Position: Sitting, Cuff Size: Large)   Pulse 84   Ht 6' (1.829 m)   Wt (!) 423 lb (191.9 kg)   SpO2 96%   BMI 57.37 kg/m  BP Readings from Last 3 Encounters:  05/10/23 139/83  04/24/23 (!) 147/88  01/05/22 135/86   Wt Readings from Last 3 Encounters:  05/10/23 (!) 423 lb (191.9 kg)  04/24/23 (!) 421 lb (191 kg)  01/05/22 (!) 400 lb (181.4 kg)      Physical Exam Constitutional:      Appearance: Normal appearance. He is obese.  HENT:     Head: Normocephalic.     Right Ear: Tympanic membrane, ear canal and external ear normal. There is no impacted cerumen.     Left Ear: Tympanic membrane, ear canal and external ear normal. There is no impacted cerumen.     Nose: Nose normal.     Mouth/Throat:     Mouth: Mucous membranes are moist.     Pharynx: No oropharyngeal exudate or posterior oropharyngeal erythema.  Eyes:     Conjunctiva/sclera: Conjunctivae normal.  Cardiovascular:     Rate and Rhythm: Normal rate and regular rhythm.  Pulmonary:     Effort: Pulmonary effort is normal.     Breath sounds: Normal breath sounds. No wheezing or rhonchi.  Neurological:     General: No focal deficit present.     Mental Status: He is alert and oriented to person, place, and time.  Psychiatric:        Mood and Affect: Mood  normal.          Assessment & Plan:  Marland KitchenMarland KitchenSyler "[name]" was seen today for cough.  Diagnoses and all orders for this visit:  Sinobronchitis -     azithromycin (ZITHROMAX Z-PAK) 250 MG tablet; Take 2 tablets (500 mg) on  Day 1,  followed by 1 tablet (250 mg) once daily on Days 2 through 5. -     methylPREDNISolone (MEDROL DOSEPAK) 4 MG TBPK tablet; Take as directed by package insert. -     ipratropium-albuterol (DUONEB) 0.5-2.5 (3) MG/3ML SOLN; Take 3 mLs by nebulization every 4 (four) hours as needed. -     DG Chest 2 View; Future  Persistent cough -     azithromycin (ZITHROMAX Z-PAK) 250 MG tablet; Take 2 tablets (500 mg) on  Day 1,  followed by 1 tablet (250 mg) once daily on Days 2 through 5. -     methylPREDNISolone (MEDROL DOSEPAK) 4 MG TBPK tablet; Take as directed by package insert. -     ipratropium-albuterol (DUONEB) 0.5-2.5 (3) MG/3ML SOLN; Take 3 mLs by nebulization every 4 (four) hours as needed. -     DG Chest 2  View; Future  Pneumonia of both lungs due to infectious organism, unspecified part of lung  CXR ordered STAT  Reassuring vitals Concerned for pneumonia based on review of CXR with streky atelectasis vers pathy infiltrate at the lingula, treated for atypical pneumonia with zpak, prednisone.  Sent duoneb solution to use 2-3 times a day while still having chest congestion and symptoms.  Follow up if not improving or symptoms worsening.    Tandy Gaw, PA-C
